# Patient Record
Sex: Female | Born: 1960 | Race: White | Hispanic: No
Health system: Midwestern US, Academic
[De-identification: ages and names within clinical notes are randomized; demographics above are authoritative.]

---

## 2016-06-09 MED ORDER — TROPICAMIDE 1 % OP DROP
1 [drp] | OPHTHALMIC | 0 refills | Status: CN
Start: 2016-06-09 — End: ?

## 2016-06-09 MED ORDER — PHENYLEPHRINE HCL 2.5 % OP DROP
1 [drp] | OPHTHALMIC | 0 refills | Status: CN
Start: 2016-06-09 — End: ?

## 2016-06-09 MED ORDER — PROPARACAINE 0.5 % OP DROP
1 [drp] | OPHTHALMIC | 0 refills | Status: CN
Start: 2016-06-09 — End: ?

## 2016-07-14 ENCOUNTER — Ambulatory Visit: Admit: 2016-07-14 | Discharge: 2016-07-15 | Payer: No Typology Code available for payment source

## 2016-07-14 ENCOUNTER — Encounter: Admit: 2016-07-14 | Discharge: 2016-07-14 | Payer: MEDICARE

## 2016-07-14 DIAGNOSIS — I8393 Asymptomatic varicose veins of bilateral lower extremities: ICD-10-CM

## 2016-07-14 DIAGNOSIS — M1990 Unspecified osteoarthritis, unspecified site: ICD-10-CM

## 2016-07-14 DIAGNOSIS — F329 Major depressive disorder, single episode, unspecified: ICD-10-CM

## 2016-07-14 DIAGNOSIS — H33312 Horseshoe tear of retina without detachment, left eye: Principal | ICD-10-CM

## 2016-07-14 DIAGNOSIS — D649 Anemia, unspecified: ICD-10-CM

## 2016-07-14 DIAGNOSIS — K589 Irritable bowel syndrome without diarrhea: ICD-10-CM

## 2016-07-14 DIAGNOSIS — I208 Other forms of angina pectoris: Principal | ICD-10-CM

## 2016-07-14 DIAGNOSIS — I201 Angina pectoris with documented spasm: ICD-10-CM

## 2016-07-14 DIAGNOSIS — H269 Unspecified cataract: ICD-10-CM

## 2016-07-14 DIAGNOSIS — R51 Headache: ICD-10-CM

## 2016-07-14 NOTE — Progress Notes
Body mass index is 46.81 kg/m.              Assessment and Plan:    Problem   Retinal Tear of Left Eye    Added automatically from request for surgery [phone]         Retinal tear of left eye  One month post laser, doing well no new tear  The patient was educated on the signs/symptoms of retinal detachment, and will call immediately with any changes or concerns  Follow-up is recommended in 3 months, if ok can be released to see her usual eye doctor

## 2016-07-14 NOTE — Assessment & Plan Note
One month post laser, doing well no new tear  The patient was educated on the signs/symptoms of retinal detachment, and will call immediately with any changes or concerns  Follow-up is recommended in 3 months, if ok can be released to see her usual eye doctor

## 2016-11-22 ENCOUNTER — Encounter: Admit: 2016-11-22 | Discharge: 2016-11-22 | Payer: MEDICARE

## 2016-11-22 NOTE — Telephone Encounter
I called pt and she wanted to know what to use for her weepy eye. I called and recommended AT's . Pt is going to try that for a week and see if this helps.

## 2017-07-23 ENCOUNTER — Encounter: Admit: 2017-07-23 | Discharge: 2017-07-23 | Payer: MEDICARE

## 2017-07-31 ENCOUNTER — Ambulatory Visit: Admit: 2017-07-31 | Discharge: 2017-08-01 | Payer: MEDICARE

## 2017-07-31 ENCOUNTER — Encounter: Admit: 2017-07-31 | Discharge: 2017-07-31 | Payer: MEDICARE

## 2017-07-31 DIAGNOSIS — I208 Other forms of angina pectoris: Principal | ICD-10-CM

## 2017-07-31 DIAGNOSIS — I201 Angina pectoris with documented spasm: ICD-10-CM

## 2017-07-31 DIAGNOSIS — F329 Major depressive disorder, single episode, unspecified: ICD-10-CM

## 2017-07-31 DIAGNOSIS — I8393 Asymptomatic varicose veins of bilateral lower extremities: ICD-10-CM

## 2017-07-31 DIAGNOSIS — D649 Anemia, unspecified: ICD-10-CM

## 2017-07-31 DIAGNOSIS — H269 Unspecified cataract: ICD-10-CM

## 2017-07-31 DIAGNOSIS — R51 Headache: ICD-10-CM

## 2017-07-31 DIAGNOSIS — M1990 Unspecified osteoarthritis, unspecified site: ICD-10-CM

## 2017-07-31 DIAGNOSIS — K589 Irritable bowel syndrome without diarrhea: ICD-10-CM

## 2017-08-01 DIAGNOSIS — H33312 Horseshoe tear of retina without detachment, left eye: Secondary | ICD-10-CM

## 2017-08-01 DIAGNOSIS — H4312 Vitreous hemorrhage, left eye: Principal | ICD-10-CM

## 2017-08-30 ENCOUNTER — Encounter: Admit: 2017-08-30 | Discharge: 2017-08-30 | Payer: MEDICARE

## 2017-08-30 ENCOUNTER — Ambulatory Visit: Admit: 2017-08-30 | Discharge: 2017-08-31 | Payer: MEDICARE

## 2017-08-30 DIAGNOSIS — I8393 Asymptomatic varicose veins of bilateral lower extremities: ICD-10-CM

## 2017-08-30 DIAGNOSIS — M1990 Unspecified osteoarthritis, unspecified site: ICD-10-CM

## 2017-08-30 DIAGNOSIS — H33312 Horseshoe tear of retina without detachment, left eye: Secondary | ICD-10-CM

## 2017-08-30 DIAGNOSIS — I201 Angina pectoris with documented spasm: ICD-10-CM

## 2017-08-30 DIAGNOSIS — H269 Unspecified cataract: ICD-10-CM

## 2017-08-30 DIAGNOSIS — F329 Major depressive disorder, single episode, unspecified: ICD-10-CM

## 2017-08-30 DIAGNOSIS — I208 Other forms of angina pectoris: Principal | ICD-10-CM

## 2017-08-30 DIAGNOSIS — D649 Anemia, unspecified: ICD-10-CM

## 2017-08-30 DIAGNOSIS — K589 Irritable bowel syndrome without diarrhea: ICD-10-CM

## 2017-08-30 DIAGNOSIS — R51 Headache: ICD-10-CM

## 2017-08-31 DIAGNOSIS — H43812 Vitreous degeneration, left eye: ICD-10-CM

## 2017-08-31 DIAGNOSIS — H4312 Vitreous hemorrhage, left eye: Principal | ICD-10-CM

## 2017-10-19 ENCOUNTER — Encounter: Admit: 2017-10-19 | Discharge: 2017-10-19 | Payer: MEDICARE

## 2017-10-23 ENCOUNTER — Encounter: Admit: 2017-10-23 | Discharge: 2017-10-23 | Payer: MEDICARE

## 2017-10-23 ENCOUNTER — Ambulatory Visit: Admit: 2017-10-23 | Discharge: 2017-10-24 | Payer: MEDICARE

## 2017-10-23 ENCOUNTER — Emergency Department: Admit: 2017-10-23 | Discharge: 2017-10-24 | Disposition: A | Discharge status: Home / Self Care | Payer: MEDICARE

## 2017-10-23 DIAGNOSIS — H4312 Vitreous hemorrhage, left eye: Principal | ICD-10-CM

## 2017-10-23 DIAGNOSIS — M1990 Unspecified osteoarthritis, unspecified site: ICD-10-CM

## 2017-10-23 DIAGNOSIS — R51 Headache: ICD-10-CM

## 2017-10-23 DIAGNOSIS — H269 Unspecified cataract: ICD-10-CM

## 2017-10-23 DIAGNOSIS — D649 Anemia, unspecified: ICD-10-CM

## 2017-10-23 DIAGNOSIS — I201 Angina pectoris with documented spasm: ICD-10-CM

## 2017-10-23 DIAGNOSIS — F329 Major depressive disorder, single episode, unspecified: ICD-10-CM

## 2017-10-23 DIAGNOSIS — K589 Irritable bowel syndrome without diarrhea: ICD-10-CM

## 2017-10-23 DIAGNOSIS — I208 Other forms of angina pectoris: Principal | ICD-10-CM

## 2017-10-23 DIAGNOSIS — I8393 Asymptomatic varicose veins of bilateral lower extremities: ICD-10-CM

## 2017-10-23 MED ORDER — PROPARACAINE 0.5 % OP DROP
1 [drp] | OPHTHALMIC | 0 refills | Status: CN
Start: 2017-10-23 — End: ?

## 2017-10-23 MED ORDER — CYCLOPENTOLATE 1 % OP DROP
1 [drp] | OPHTHALMIC | 0 refills | Status: CN
Start: 2017-10-23 — End: ?

## 2017-10-23 MED ORDER — PHENYLEPHRINE HCL 2.5 % OP DROP
1 [drp] | OPHTHALMIC | 0 refills | Status: CN
Start: 2017-10-23 — End: ?

## 2017-10-24 ENCOUNTER — Ambulatory Visit: Admit: 2017-10-24 | Discharge: 2017-10-24 | Payer: MEDICARE

## 2017-10-24 ENCOUNTER — Encounter: Admit: 2017-10-24 | Discharge: 2017-10-24 | Payer: MEDICARE

## 2017-10-24 DIAGNOSIS — G4730 Sleep apnea, unspecified: ICD-10-CM

## 2017-10-24 DIAGNOSIS — H4312 Vitreous hemorrhage, left eye: ICD-10-CM

## 2017-10-24 DIAGNOSIS — H269 Unspecified cataract: ICD-10-CM

## 2017-10-24 DIAGNOSIS — H33312 Horseshoe tear of retina without detachment, left eye: Principal | ICD-10-CM

## 2017-10-24 DIAGNOSIS — H5460 Unqualified visual loss, one eye, unspecified: Principal | ICD-10-CM

## 2017-10-24 DIAGNOSIS — M1990 Unspecified osteoarthritis, unspecified site: ICD-10-CM

## 2017-10-24 DIAGNOSIS — K589 Irritable bowel syndrome without diarrhea: ICD-10-CM

## 2017-10-24 DIAGNOSIS — F329 Major depressive disorder, single episode, unspecified: ICD-10-CM

## 2017-10-24 DIAGNOSIS — M797 Fibromyalgia: ICD-10-CM

## 2017-10-24 DIAGNOSIS — R030 Elevated blood-pressure reading, without diagnosis of hypertension: ICD-10-CM

## 2017-10-24 DIAGNOSIS — I8393 Asymptomatic varicose veins of bilateral lower extremities: ICD-10-CM

## 2017-10-24 DIAGNOSIS — I208 Other forms of angina pectoris: Principal | ICD-10-CM

## 2017-10-24 DIAGNOSIS — R51 Headache: ICD-10-CM

## 2017-10-24 DIAGNOSIS — D649 Anemia, unspecified: ICD-10-CM

## 2017-10-24 DIAGNOSIS — I201 Angina pectoris with documented spasm: ICD-10-CM

## 2017-10-24 MED ORDER — PROPARACAINE 0.5 % OP DROP
1 [drp] | OPHTHALMIC | 0 refills | Status: DC
Start: 2017-10-24 — End: 2017-10-24
  Administered 2017-10-24: 15:00:00 1 [drp] via OPHTHALMIC

## 2017-10-24 MED ORDER — CEFAZOLIN INJ 1GM IVP
0 refills | Status: DC
Start: 2017-10-24 — End: 2017-10-24
  Administered 2017-10-24: 16:00:00 50 mg via SUBCONJUNCTIVAL

## 2017-10-24 MED ORDER — DIFLUPREDNATE 0.05 % OP DROP
1 [drp] | Freq: Four times a day (QID) | OPHTHALMIC | 1 refills | 19.00000 days | Status: AC
Start: 2017-10-24 — End: ?

## 2017-10-24 MED ORDER — CYCLOPENTOLATE 1 % OP DROP
1 [drp] | OPHTHALMIC | 0 refills | Status: CN
Start: 2017-10-24 — End: ?

## 2017-10-24 MED ORDER — BUPIVACAINE 0.75%-LIDOCAINE 2%-HYALURONIDASE 150 UNITS SYR
0 refills | Status: DC
Start: 2017-10-24 — End: 2017-10-24
  Administered 2017-10-24 (×3): 7.5 mL via RETROBULBAR

## 2017-10-24 MED ORDER — ERYTHROMYCIN 5 MG/GRAM (0.5 %) OP OINT
0 refills | Status: DC
Start: 2017-10-24 — End: 2017-10-24
  Administered 2017-10-24: 16:00:00 0.25 [in_us] via OPHTHALMIC

## 2017-10-24 MED ORDER — DEXAMETHASONE SODIUM PHOSPHATE 4 MG/ML IJ SOLN
0 refills | Status: DC
Start: 2017-10-24 — End: 2017-10-24
  Administered 2017-10-24: 16:00:00 2 mg via SUBCONJUNCTIVAL

## 2017-10-24 MED ORDER — ATROPINE 1 % OP DROP
0 refills | Status: DC
Start: 2017-10-24 — End: 2017-10-24
  Administered 2017-10-24: 16:00:00 1 [drp] via OPHTHALMIC

## 2017-10-24 MED ORDER — PROPOFOL INJ 10 MG/ML IV VIAL
0 refills | Status: DC
Start: 2017-10-24 — End: 2017-10-24
  Administered 2017-10-24: 15:00:00 30 mg via INTRAVENOUS
  Administered 2017-10-24: 15:00:00 70 mg via INTRAVENOUS

## 2017-10-24 MED ORDER — OFLOXACIN 0.3 % OP DROP
1 [drp] | Freq: Four times a day (QID) | OPHTHALMIC | 0 refills | 32.50000 days | Status: AC
Start: 2017-10-24 — End: 2018-04-12

## 2017-10-24 MED ORDER — PROPARACAINE 0.5 % OP DROP
1 [drp] | OPHTHALMIC | 0 refills | Status: CN
Start: 2017-10-24 — End: ?

## 2017-10-24 MED ORDER — PHENYLEPHRINE HCL 2.5 % OP DROP
1 [drp] | OPHTHALMIC | 0 refills | Status: CN
Start: 2017-10-24 — End: ?

## 2017-10-24 MED ORDER — BALANCED SALT SOLN NO.2 IRRIG. IO SOLN
0 refills | Status: DC
Start: 2017-10-24 — End: 2017-10-24
  Administered 2017-10-24: 16:00:00 15 mL via OPHTHALMIC

## 2017-10-24 MED ORDER — CYCLOPENTOLATE 1 % OP DROP
1 [drp] | OPHTHALMIC | 0 refills | Status: CP
Start: 2017-10-24 — End: ?
  Administered 2017-10-24: 15:00:00 1 [drp] via OPHTHALMIC

## 2017-10-24 MED ORDER — EPINEPHRINE 0.3 MG IN ISOTONIC OPHTH IRR(BSS+)
0 refills | Status: DC
Start: 2017-10-24 — End: 2017-10-24
  Administered 2017-10-24 (×2): 500 mL via INTRAOCULAR

## 2017-10-24 MED ORDER — PHENYLEPHRINE HCL 2.5 % OP DROP
1 [drp] | OPHTHALMIC | 0 refills | Status: CP
Start: 2017-10-24 — End: ?
  Administered 2017-10-24: 15:00:00 1 [drp] via OPHTHALMIC

## 2017-10-24 MED ORDER — MIDAZOLAM 1 MG/ML IJ SOLN
INTRAVENOUS | 0 refills | Status: DC
Start: 2017-10-24 — End: 2017-10-24
  Administered 2017-10-24: 15:00:00 2 mg via INTRAVENOUS

## 2017-10-25 ENCOUNTER — Ambulatory Visit: Admit: 2017-10-25 | Discharge: 2017-10-26 | Payer: MEDICARE

## 2017-10-25 DIAGNOSIS — H4312 Vitreous hemorrhage, left eye: Principal | ICD-10-CM

## 2017-10-26 ENCOUNTER — Encounter: Admit: 2017-10-26 | Discharge: 2017-10-26 | Payer: MEDICARE

## 2017-10-26 DIAGNOSIS — D649 Anemia, unspecified: ICD-10-CM

## 2017-10-26 DIAGNOSIS — H269 Unspecified cataract: ICD-10-CM

## 2017-10-26 DIAGNOSIS — I208 Other forms of angina pectoris: Principal | ICD-10-CM

## 2017-10-26 DIAGNOSIS — G4730 Sleep apnea, unspecified: ICD-10-CM

## 2017-10-26 DIAGNOSIS — I8393 Asymptomatic varicose veins of bilateral lower extremities: ICD-10-CM

## 2017-10-26 DIAGNOSIS — I201 Angina pectoris with documented spasm: ICD-10-CM

## 2017-10-26 DIAGNOSIS — K589 Irritable bowel syndrome without diarrhea: ICD-10-CM

## 2017-10-26 DIAGNOSIS — F329 Major depressive disorder, single episode, unspecified: ICD-10-CM

## 2017-10-26 DIAGNOSIS — M1990 Unspecified osteoarthritis, unspecified site: ICD-10-CM

## 2017-10-26 DIAGNOSIS — R51 Headache: ICD-10-CM

## 2017-11-01 ENCOUNTER — Encounter: Admit: 2017-11-01 | Discharge: 2017-11-01 | Payer: MEDICARE

## 2017-11-02 ENCOUNTER — Ambulatory Visit: Admit: 2017-11-02 | Discharge: 2017-11-03 | Payer: MEDICARE

## 2017-11-02 DIAGNOSIS — H33312 Horseshoe tear of retina without detachment, left eye: Principal | ICD-10-CM

## 2017-11-02 DIAGNOSIS — H4312 Vitreous hemorrhage, left eye: ICD-10-CM

## 2017-12-03 ENCOUNTER — Ambulatory Visit: Admit: 2017-12-03 | Discharge: 2017-12-04 | Payer: MEDICARE

## 2017-12-03 DIAGNOSIS — H43812 Vitreous degeneration, left eye: Principal | ICD-10-CM

## 2017-12-03 DIAGNOSIS — H33312 Horseshoe tear of retina without detachment, left eye: ICD-10-CM

## 2017-12-03 DIAGNOSIS — H2512 Age-related nuclear cataract, left eye: ICD-10-CM

## 2017-12-03 DIAGNOSIS — H4312 Vitreous hemorrhage, left eye: ICD-10-CM

## 2018-02-18 ENCOUNTER — Ambulatory Visit: Admit: 2018-02-18 | Discharge: 2018-02-19 | Payer: MEDICARE

## 2018-02-18 NOTE — Assessment & Plan Note
No new tear or RD  The signs and symptoms of retinal tear and retinal detachment were reviewed.  The patient was instructed to call or return to ED immediately with changes or concerns.  RTC 6 mo

## 2018-02-19 DIAGNOSIS — H33312 Horseshoe tear of retina without detachment, left eye: ICD-10-CM

## 2018-02-19 DIAGNOSIS — H2512 Age-related nuclear cataract, left eye: Principal | ICD-10-CM

## 2018-03-15 ENCOUNTER — Encounter: Admit: 2018-03-15 | Discharge: 2018-03-15 | Payer: MEDICARE

## 2018-03-15 ENCOUNTER — Ambulatory Visit: Admit: 2018-03-15 | Discharge: 2018-03-16 | Payer: MEDICARE

## 2018-03-15 ENCOUNTER — Ambulatory Visit: Admit: 2018-07-11 | Discharge: 2018-07-11

## 2018-03-15 DIAGNOSIS — F329 Major depressive disorder, single episode, unspecified: ICD-10-CM

## 2018-03-15 DIAGNOSIS — H2512 Age-related nuclear cataract, left eye: Principal | ICD-10-CM

## 2018-03-15 DIAGNOSIS — I8393 Asymptomatic varicose veins of bilateral lower extremities: ICD-10-CM

## 2018-03-15 DIAGNOSIS — K589 Irritable bowel syndrome without diarrhea: ICD-10-CM

## 2018-03-15 DIAGNOSIS — R51 Headache: ICD-10-CM

## 2018-03-15 DIAGNOSIS — I201 Angina pectoris with documented spasm: ICD-10-CM

## 2018-03-15 DIAGNOSIS — H269 Unspecified cataract: ICD-10-CM

## 2018-03-15 DIAGNOSIS — G4730 Sleep apnea, unspecified: ICD-10-CM

## 2018-03-15 DIAGNOSIS — M1990 Unspecified osteoarthritis, unspecified site: ICD-10-CM

## 2018-03-15 DIAGNOSIS — I208 Other forms of angina pectoris: Principal | ICD-10-CM

## 2018-03-15 DIAGNOSIS — D649 Anemia, unspecified: ICD-10-CM

## 2018-03-15 MED ORDER — ILEVRO 0.3 % OP DRPS
1 [drp] | Freq: Every day | OPHTHALMIC | 1 refills | Status: AC
Start: 2018-03-15 — End: ?

## 2018-03-15 MED ORDER — DIFLUPREDNATE 0.05 % OP DROP
1 [drp] | Freq: Four times a day (QID) | OPHTHALMIC | 0 refills | 19.00000 days | Status: AC
Start: 2018-03-15 — End: ?

## 2018-03-16 DIAGNOSIS — H2512 Age-related nuclear cataract, left eye: Principal | ICD-10-CM

## 2018-03-20 ENCOUNTER — Encounter: Admit: 2018-03-20 | Discharge: 2018-03-20 | Payer: MEDICARE

## 2018-03-20 NOTE — Telephone Encounter
Received fax from [facility] that they have been unable to reach pt regarding surgery drops. I left a VM on pt's phone to please call the pharmacy at [phone] ext. [phone]

## 2018-04-05 ENCOUNTER — Encounter: Admit: 2018-04-05 | Discharge: 2018-04-05 | Payer: MEDICARE

## 2018-04-08 NOTE — Telephone Encounter
Score: Malnutrition Screening Tool (MST): 0  Additional Nutrition Assessment: Other (Comment)(Intentionally lost 22lbs with change in diet. )     Social & Financial:  Social and Financial Assessment: No needs identified  Tobacco assessment last 30 days: Patient has not used tobacco products within the last 30 days     Spiritual & Emotional:  Spiritual and Emotional Assessment: No needs identified     Physical:  Fall Risk: None identified     Communication:  Communication Barrier: No     Onc Fertility:   Onc Fertility Assessment: Not applicable     Additional Education:  Additional Education Documented: Yes    Patient Education  Education provided to: patient     Are learners ready to learn?: Yes  Are there barriers to learning?: No        Topics Discussed  Topics discussed: [facility] to [facility]     Ph # given to patient for follow up: Yes    Education Details  Educated by: telephone  Ed  time: 5 min  Learner's response: The patient expressed understanding of what was explained to them, participated and agreed with the present plan.;The patient has received contact information and was instructed on how to contact us if questions or concerns arise

## 2018-04-08 NOTE — Progress Notes
.  Pre Clinic Pre Chart:    Problem List:  1. Large rectal TVA  2. Fibromyalgia   3. OSA    03/25/2018 - Colonoscopy:  A digital rectal exam was performed with normal sphincter tone.  No masses palpated, circumferential external skin tags present without thrombosis.  After completion of the digital rectal exam, the colonoscope was introduced and advanced inside the colon without difficulty.  The cecum was identified with photodocumentation of the appendiceal orifice, ileocecal valve.  The bowel prep was 7 out of 9 on a Boston bowel prep scale.  After reaching the cecum, the scope was withdrawn for 13 minutes with circumferential examination of the colonic wall.  2 small hyperplastic appearing polyps measuring 2 x 3 and 3 x 3 mm in the mid sigmoid colon were completely removed using cold forceps polypectomy.  Large 4 x 2 mm pedunculated polyp was found in the rectum approximately 10 cm from the anal verge on the patient's right-hand side.  The polyp was not hemorrhagic or ulcerated, morphological appearance was multiple lobulations present on approximately 2 cm stalk.  The base of the polyp was saline lifted in 3 location successfully.  3 surgical clips placed at the base of the stock left middle and right aspect.  Hot snare was attempted to remove the polyp in 1 piece however initial polypectomy removed approximately two thirds of the polyp.  Repeat snare removed and additional quarter of the polyp.  Multiple cold biopsies were taken of the center of the lesion on the progesterone left just above the surgical clips noted below the surgical clip still abnormal tissue and protruding lesion still present.  The colonoscope was then straightened and any excess air was suctioned.  Patient tolerated the procedure well.  Findings:  Large rectal 4 x 2 cm polyp approximately 10 cm from the anal verge, incomplete removal.  Recommendations follow-up with me in clinic with 1 week for pathology results.  Pathology:

## 2018-04-10 ENCOUNTER — Encounter: Admit: 2018-04-10 | Discharge: 2018-04-10 | Payer: MEDICARE

## 2018-04-10 DIAGNOSIS — I208 Other forms of angina pectoris: Principal | ICD-10-CM

## 2018-04-10 DIAGNOSIS — G4730 Sleep apnea, unspecified: ICD-10-CM

## 2018-04-10 DIAGNOSIS — F329 Major depressive disorder, single episode, unspecified: ICD-10-CM

## 2018-04-10 DIAGNOSIS — K589 Irritable bowel syndrome without diarrhea: ICD-10-CM

## 2018-04-10 DIAGNOSIS — H269 Unspecified cataract: ICD-10-CM

## 2018-04-10 DIAGNOSIS — D649 Anemia, unspecified: ICD-10-CM

## 2018-04-10 DIAGNOSIS — M1990 Unspecified osteoarthritis, unspecified site: ICD-10-CM

## 2018-04-10 DIAGNOSIS — K6289 Other specified diseases of anus and rectum: Principal | ICD-10-CM

## 2018-04-10 DIAGNOSIS — I201 Angina pectoris with documented spasm: ICD-10-CM

## 2018-04-10 DIAGNOSIS — R51 Headache: ICD-10-CM

## 2018-04-10 DIAGNOSIS — D128 Benign neoplasm of rectum: Principal | ICD-10-CM

## 2018-04-10 DIAGNOSIS — I8393 Asymptomatic varicose veins of bilateral lower extremities: ICD-10-CM

## 2018-04-10 MED ORDER — ONDANSETRON 8 MG PO TBDI
8 mg | ORAL_TABLET | ORAL | 0 refills | Status: SS | PRN
Start: 2018-04-10 — End: 2018-07-11

## 2018-04-10 MED ORDER — SODIUM CHLORIDE 0.9 % IV SOLP
250 mL | INTRAVENOUS | 0 refills | Status: CN
Start: 2018-04-10 — End: ?

## 2018-04-10 MED ORDER — CEFOXITIN 2G/100ML NS IVPB (MB+) (OR ONLY)
2 g | Freq: Once | INTRAVENOUS | 0 refills | Status: CN
Start: 2018-04-10 — End: ?

## 2018-04-10 NOTE — Progress Notes
??? Lung disease     small airway lung disease, lung capacity diminished   ??? Sleep apnea     APAP   ??? Variant angina (HCC)     2011   ??? Varicose veins of both lower extremities without ulcer or inflammation      Surgical History:   Procedure Laterality Date   ??? DILATION AND CURETTAGE  1983    after a miscarriage   ??? NOSE SURGERY  1988    septum deviated   ??? GALLBLADDER SURGERY  2009    removal   ??? FOOT SURGERY Bilateral 2011    bilateral fasciotomy   ??? CARPAL TUNNEL RELEASE Right 2011   ??? HEART CATHETERIZATION  2011    no stents, diagnosed with variant angina   ??? KNEE REPLACEMENT Left 2016    partial   ??? HX RETINAL LASER  2018    retinal tear   ??? PARS PLANA MECHANICAL VITRECTOMY WITH FOCAL ENDOLASER PHOTOCOAGULATION Left 10/24/2017    Performed by Blair Hailey, MD at [facility] OR   ??? COLONOSCOPY  03/25/2018   ??? HX ARTHROSCOPIC SURGERY Bilateral 2015, 2016    knees     No family history on file.  Social History     Socioeconomic History   ??? Marital status: Divorced     Spouse name: Not on file   ??? Number of children: Not on file   ??? Years of education: Not on file   ??? Highest education level: Not on file   Occupational History   ??? Not on file   Tobacco Use   ??? Smoking status: Never Smoker   ??? Smokeless tobacco: Never Used   Substance and Sexual Activity   ??? Alcohol use: Yes     Alcohol/week: 0.0 standard drinks     Comment: rarely   ??? Drug use: Never   ??? Sexual activity: Not on file   Other Topics Concern   ??? Not on file   Social History Narrative   ??? Not on file        Review of Systems   Constitutional: Positive for fatigue.   HENT: Negative.    Eyes: Positive for pain and visual disturbance.   Respiratory: Positive for apnea.    Cardiovascular: Negative.    Gastrointestinal: Positive for anal bleeding and diarrhea.   Endocrine: Negative.    Genitourinary: Negative.    Musculoskeletal: Positive for arthralgias, gait problem and myalgias.   Skin: Negative.    Allergic/Immunologic: Negative.    Hematological: Negative. Mental Status: She is alert.   Psychiatric:         Mood and Affect: Mood normal.         Behavior: Behavior normal.         Thought Content: Thought content normal.     Assessment and Plan:  58 year old female with past medical history of fibromyalgia, OSA, pulmonary nodules (stable on serial CT), obesity and HTN now with rectal polyp found on a routine screening colonoscopy which was removed in a piecemeal fashion and biopsy showing tubulovillous adenoma.    -We had a long discussion with Mrs. Shells in regards to her current diagnosis and that it is a precursor to cancer.  With the polyp being removed in a piecemeal fashion, we discussed surgical intervention for a complete resection is removed.  She does wish to have a transanal approach and prefers against a colonic resection.  We did perform a flex sigmoidoscopy today in  clinic which did show the lesion to be on the right side and roughly 6 cm from the anal verge.  There was a clip still in place and no evidence of bleeding.  With the location of this tumor a Transanal Minimally Invasive Surgery (TAMIS) is a good option for her.  Since she does not have a diagnosis of cancer, we will not plan for any staging imaging at this time.  We did discuss the low likely hood of pathology resulting as having cancer within it and need for further workup after the procedure if that were the case.  Risks of the procedure were discussed including risk of intraabdominal perforation if the lesion is proximal enough as well as anterior.  She demonstrated good understanding of these risks and benefits and wishes to proceed with the flex sigmoidoscopy today and subsequent surgical intervention.  All of her questions were answered to her satisfaction.  -Patient seen and discussed with Dr. Daphine Deutscher.    Rowland Lathe, DO  General Surgery PGY-4    ATTESTATION    I personally performed the key portions of the E/M visit, discussed case

## 2018-04-11 ENCOUNTER — Encounter: Admit: 2018-04-11 | Discharge: 2018-04-11 | Payer: MEDICARE

## 2018-04-12 ENCOUNTER — Encounter: Admit: 2018-04-12 | Discharge: 2018-04-12 | Payer: MEDICARE

## 2018-04-12 ENCOUNTER — Ambulatory Visit: Admit: 2018-04-12 | Discharge: 2018-04-13 | Payer: MEDICARE

## 2018-04-12 DIAGNOSIS — G4730 Sleep apnea, unspecified: ICD-10-CM

## 2018-04-12 DIAGNOSIS — R51 Headache: ICD-10-CM

## 2018-04-12 DIAGNOSIS — I208 Other forms of angina pectoris: Principal | ICD-10-CM

## 2018-04-12 DIAGNOSIS — F329 Major depressive disorder, single episode, unspecified: ICD-10-CM

## 2018-04-12 DIAGNOSIS — I201 Angina pectoris with documented spasm: ICD-10-CM

## 2018-04-12 DIAGNOSIS — M1990 Unspecified osteoarthritis, unspecified site: ICD-10-CM

## 2018-04-12 DIAGNOSIS — H269 Unspecified cataract: ICD-10-CM

## 2018-04-12 DIAGNOSIS — J984 Other disorders of lung: ICD-10-CM

## 2018-04-12 DIAGNOSIS — I8393 Asymptomatic varicose veins of bilateral lower extremities: ICD-10-CM

## 2018-04-12 LAB — CBC: Lab: 7.6 10*3/uL (ref 4.5–11.0)

## 2018-04-12 LAB — BASIC METABOLIC PANEL
Lab: 10 pg (ref 3–12)
Lab: 107 MMOL/L (ref 98–110)
Lab: 4.2 MMOL/L (ref 3.5–5.1)

## 2018-04-13 DIAGNOSIS — Z01818 Encounter for other preprocedural examination: Principal | ICD-10-CM

## 2018-04-15 ENCOUNTER — Encounter: Admit: 2018-04-15 | Discharge: 2018-04-15 | Payer: MEDICARE

## 2018-04-15 NOTE — Progress Notes
Patient informed of new surgery start time and informed to arrive by 1200. Patient verbalized understanding.

## 2018-04-16 ENCOUNTER — Encounter: Admit: 2018-04-16 | Discharge: 2018-04-16 | Payer: MEDICARE

## 2018-04-16 ENCOUNTER — Encounter: Admit: 2018-04-12 | Discharge: 2018-04-12 | Payer: MEDICARE

## 2018-04-16 ENCOUNTER — Ambulatory Visit: Admit: 2018-04-16 | Discharge: 2018-04-16 | Payer: MEDICARE

## 2018-04-16 DIAGNOSIS — I201 Angina pectoris with documented spasm: ICD-10-CM

## 2018-04-16 DIAGNOSIS — H269 Unspecified cataract: ICD-10-CM

## 2018-04-16 DIAGNOSIS — D128 Benign neoplasm of rectum: Principal | ICD-10-CM

## 2018-04-16 DIAGNOSIS — F329 Major depressive disorder, single episode, unspecified: ICD-10-CM

## 2018-04-16 DIAGNOSIS — M1990 Unspecified osteoarthritis, unspecified site: ICD-10-CM

## 2018-04-16 DIAGNOSIS — I10 Essential (primary) hypertension: ICD-10-CM

## 2018-04-16 DIAGNOSIS — G4733 Obstructive sleep apnea (adult) (pediatric): ICD-10-CM

## 2018-04-16 DIAGNOSIS — M797 Fibromyalgia: ICD-10-CM

## 2018-04-16 DIAGNOSIS — K6289 Other specified diseases of anus and rectum: ICD-10-CM

## 2018-04-16 DIAGNOSIS — Z6841 Body Mass Index (BMI) 40.0 and over, adult: ICD-10-CM

## 2018-04-16 DIAGNOSIS — I8393 Asymptomatic varicose veins of bilateral lower extremities: ICD-10-CM

## 2018-04-16 DIAGNOSIS — I208 Other forms of angina pectoris: Principal | ICD-10-CM

## 2018-04-16 DIAGNOSIS — G4730 Sleep apnea, unspecified: ICD-10-CM

## 2018-04-16 DIAGNOSIS — E669 Obesity, unspecified: ICD-10-CM

## 2018-04-16 DIAGNOSIS — J984 Other disorders of lung: ICD-10-CM

## 2018-04-16 DIAGNOSIS — R51 Headache: ICD-10-CM

## 2018-04-16 MED ORDER — PROPOFOL INJ 10 MG/ML IV VIAL
0 refills | Status: DC
Start: 2018-04-16 — End: 2018-04-16
  Administered 2018-04-16: 20:00:00 150 mg via INTRAVENOUS

## 2018-04-16 MED ORDER — ROCURONIUM 10 MG/ML IV SOLN
INTRAVENOUS | 0 refills | Status: DC
Start: 2018-04-16 — End: 2018-04-16
  Administered 2018-04-16: 21:00:00 10 mg via INTRAVENOUS
  Administered 2018-04-16: 20:00:00 40 mg via INTRAVENOUS

## 2018-04-16 MED ORDER — LIDOCAINE (PF) 200 MG/10 ML (2 %) IJ SYRG
0 refills | Status: DC
Start: 2018-04-16 — End: 2018-04-16
  Administered 2018-04-16: 20:00:00 100 mg via INTRAVENOUS

## 2018-04-16 MED ORDER — HYDROMORPHONE (PF) 2 MG/ML IJ SYRG
.5 mg | INTRAVENOUS | 0 refills | Status: DC | PRN
Start: 2018-04-16 — End: 2018-04-17

## 2018-04-16 MED ORDER — PHENYLEPHRINE IN 0.9% NACL(PF) 1 MG/10 ML (100 MCG/ML) IV SYRG
INTRAVENOUS | 0 refills | Status: DC
Start: 2018-04-16 — End: 2018-04-16
  Administered 2018-04-16 (×2): 100 ug via INTRAVENOUS

## 2018-04-16 MED ORDER — DEXAMETHASONE SODIUM PHOSPHATE 4 MG/ML IJ SOLN
INTRAVENOUS | 0 refills | Status: DC
Start: 2018-04-16 — End: 2018-04-16
  Administered 2018-04-16: 21:00:00 4 mg via INTRAVENOUS

## 2018-04-16 MED ORDER — ACETAMINOPHEN 500 MG PO TAB
1000 mg | Freq: Once | ORAL | 0 refills | Status: CP
Start: 2018-04-16 — End: ?
  Administered 2018-04-16: 20:00:00 1000 mg via ORAL

## 2018-04-16 MED ORDER — ONDANSETRON HCL (PF) 4 MG/2 ML IJ SOLN
INTRAVENOUS | 0 refills | Status: DC
Start: 2018-04-16 — End: 2018-04-16
  Administered 2018-04-16: 22:00:00 4 mg via INTRAVENOUS

## 2018-04-16 MED ORDER — LIDOCAINE HCL 10 MG/ML (1 %) IJ SOLN
0 refills | Status: DC
Start: 2018-04-16 — End: 2018-04-16
  Administered 2018-04-16: 22:00:00 10 mL via INTRAMUSCULAR

## 2018-04-16 MED ORDER — PATCH DOCUMENTATION - SCOPOLAMINE BASE 1 MG/72HR
1 | Freq: Two times a day (BID) | TRANSDERMAL | 0 refills | Status: DC
Start: 2018-04-16 — End: 2018-04-17

## 2018-04-16 MED ORDER — PROMETHAZINE 25 MG/ML IJ SOLN
6.25 mg | INTRAVENOUS | 0 refills | Status: DC | PRN
Start: 2018-04-16 — End: 2018-04-17

## 2018-04-16 MED ORDER — PROPOFOL 10 MG/ML IV EMUL (INFUSION)(AM)(OR)
0 refills | Status: DC
Start: 2018-04-16 — End: 2018-04-16
  Administered 2018-04-16: 20:00:00 150 ug/kg/min via INTRAVENOUS
  Administered 2018-04-16: 21:00:00 100.000 mL via INTRAVENOUS

## 2018-04-16 MED ORDER — OXYCODONE 5 MG PO TAB
5-10 mg | Freq: Once | ORAL | 0 refills | Status: DC | PRN
Start: 2018-04-16 — End: 2018-04-17

## 2018-04-16 MED ORDER — SCOPOLAMINE BASE 1 MG OVER 3 DAYS TD PT3D
1 | Freq: Once | TRANSDERMAL | 0 refills | Status: DC
Start: 2018-04-16 — End: 2018-04-17
  Administered 2018-04-16: 20:00:00 1 via TRANSDERMAL

## 2018-04-16 MED ORDER — OXYCODONE 5 MG PO TAB
5-10 mg | ORAL_TABLET | ORAL | 0 refills | Status: SS | PRN
Start: 2018-04-16 — End: 2018-07-11

## 2018-04-16 MED ORDER — DEXTRAN 70-HYPROMELLOSE (PF) 0.1-0.3 % OP DPET
0 refills | Status: DC
Start: 2018-04-16 — End: 2018-04-16
  Administered 2018-04-16: 20:00:00 2 [drp] via OPHTHALMIC

## 2018-04-16 MED ORDER — HALOPERIDOL LACTATE 5 MG/ML IJ SOLN
1 mg | Freq: Once | INTRAVENOUS | 0 refills | Status: DC | PRN
Start: 2018-04-16 — End: 2018-04-17

## 2018-04-16 MED ORDER — CEFOXITIN 2G/100ML NS IVPB (MB+) (OR ONLY)
2 g | Freq: Once | INTRAVENOUS | 0 refills | Status: CP
Start: 2018-04-16 — End: ?
  Administered 2018-04-16 (×2): 2 g via INTRAVENOUS

## 2018-04-16 MED ORDER — SODIUM CHLORIDE 0.9 % IV SOLP
250 mL | INTRAVENOUS | 0 refills | Status: DC
Start: 2018-04-16 — End: 2018-04-17
  Administered 2018-04-16: 20:00:00 250 mL via INTRAVENOUS

## 2018-04-16 MED ORDER — FENTANYL CITRATE (PF) 50 MCG/ML IJ SOLN
50 ug | INTRAVENOUS | 0 refills | Status: DC | PRN
Start: 2018-04-16 — End: 2018-04-17

## 2018-04-16 MED ORDER — OXYCODONE 5 MG PO TAB
5-10 mg | ORAL_TABLET | ORAL | 0 refills | 6.00000 days | Status: AC | PRN
Start: 2018-04-16 — End: 2018-04-16
  Filled 2018-04-16 (×2): qty 20, 2d supply, fill #1

## 2018-04-16 MED ORDER — HYDROMORPHONE (PF) 2 MG/ML IJ SYRG
0 refills | Status: DC
Start: 2018-04-16 — End: 2018-04-16
  Administered 2018-04-16: 21:00:00 0.5 mg via INTRAVENOUS
  Administered 2018-04-16: 20:00:00 1 mg via INTRAVENOUS
  Administered 2018-04-16: 22:00:00 0.5 mg via INTRAVENOUS

## 2018-04-16 MED ORDER — LACTATED RINGERS IV SOLP
0 refills | Status: DC
Start: 2018-04-16 — End: 2018-04-16
  Administered 2018-04-16: 20:00:00 via INTRAVENOUS

## 2018-04-16 MED ORDER — SUGAMMADEX 100 MG/ML IV SOLN
INTRAVENOUS | 0 refills | Status: DC
Start: 2018-04-16 — End: 2018-04-16
  Administered 2018-04-16: 22:00:00 239 mg via INTRAVENOUS

## 2018-04-16 MED ORDER — BUPIVACAINE 0.25 % (2.5 MG/ML) IJ SOLN
0 refills | Status: DC
Start: 2018-04-16 — End: 2018-04-16
  Administered 2018-04-16: 22:00:00 10 mL via INTRAMUSCULAR

## 2018-04-16 MED ORDER — SUCCINYLCHOLINE CHLORIDE 20 MG/ML IJ SOLN
INTRAVENOUS | 0 refills | Status: DC
Start: 2018-04-16 — End: 2018-04-16
  Administered 2018-04-16: 20:00:00 200 mg via INTRAVENOUS

## 2018-04-16 MED ORDER — GABAPENTIN 300 MG PO CAP
600 mg | Freq: Once | ORAL | 0 refills | Status: CP
Start: 2018-04-16 — End: ?
  Administered 2018-04-16: 20:00:00 600 mg via ORAL

## 2018-04-16 MED ORDER — MIDAZOLAM 1 MG/ML IJ SOLN
INTRAVENOUS | 0 refills | Status: DC
Start: 2018-04-16 — End: 2018-04-16
  Administered 2018-04-16: 20:00:00 2 mg via INTRAVENOUS

## 2018-04-18 ENCOUNTER — Encounter: Admit: 2018-04-18 | Discharge: 2018-04-18 | Payer: MEDICARE

## 2018-04-18 DIAGNOSIS — M1990 Unspecified osteoarthritis, unspecified site: ICD-10-CM

## 2018-04-18 DIAGNOSIS — H269 Unspecified cataract: ICD-10-CM

## 2018-04-18 DIAGNOSIS — F329 Major depressive disorder, single episode, unspecified: ICD-10-CM

## 2018-04-18 DIAGNOSIS — I201 Angina pectoris with documented spasm: ICD-10-CM

## 2018-04-18 DIAGNOSIS — I208 Other forms of angina pectoris: Principal | ICD-10-CM

## 2018-04-18 DIAGNOSIS — R51 Headache: ICD-10-CM

## 2018-04-18 DIAGNOSIS — G4730 Sleep apnea, unspecified: ICD-10-CM

## 2018-04-18 DIAGNOSIS — J984 Other disorders of lung: ICD-10-CM

## 2018-04-18 DIAGNOSIS — I8393 Asymptomatic varicose veins of bilateral lower extremities: ICD-10-CM

## 2018-04-22 ENCOUNTER — Encounter: Admit: 2018-04-22 | Discharge: 2018-04-22 | Payer: MEDICARE

## 2018-04-22 NOTE — Telephone Encounter
[facility] called over the weekend and reports that her voice is hoarse following surgery wit Dr. Daphine Deutscher last week. It has gotten progressivly worse and she now has a productive cough. Pt asked to be called back.     Pt called back today and LVM. When pt returned call, she reports that she was able to get into her PCP today. They have sent Rx for ABx and determined she has bronchitis. Pt informed to call us should she have any needs from our office. She v/u an appreciated call back.

## 2018-06-18 ENCOUNTER — Encounter: Admit: 2018-06-18 | Discharge: 2018-06-18

## 2018-06-18 NOTE — Telephone Encounter
Call to [facility] left voice message regarding time change on 07/10/2018 from 11:30am to 1:15pm with Dr. Hassell Done and Flex Sig at 1:00pm at [facility] location will mail appointment information.

## 2018-07-03 ENCOUNTER — Encounter: Admit: 2018-07-03 | Discharge: 2018-07-03

## 2018-07-03 DIAGNOSIS — G4730 Sleep apnea, unspecified: Secondary | ICD-10-CM

## 2018-07-03 DIAGNOSIS — I208 Other forms of angina pectoris: Secondary | ICD-10-CM

## 2018-07-03 DIAGNOSIS — I8393 Asymptomatic varicose veins of bilateral lower extremities: Secondary | ICD-10-CM

## 2018-07-03 DIAGNOSIS — R51 Headache: Secondary | ICD-10-CM

## 2018-07-03 DIAGNOSIS — I10 Essential (primary) hypertension: Secondary | ICD-10-CM

## 2018-07-03 DIAGNOSIS — F329 Major depressive disorder, single episode, unspecified: Secondary | ICD-10-CM

## 2018-07-03 DIAGNOSIS — H269 Unspecified cataract: Secondary | ICD-10-CM

## 2018-07-03 DIAGNOSIS — M1990 Unspecified osteoarthritis, unspecified site: Secondary | ICD-10-CM

## 2018-07-03 DIAGNOSIS — J984 Other disorders of lung: Secondary | ICD-10-CM

## 2018-07-03 DIAGNOSIS — I201 Angina pectoris with documented spasm: Secondary | ICD-10-CM

## 2018-07-03 NOTE — Telephone Encounter
I called to speak with pt.  I left a message on her vm stating she does not need to do any gtts prior to having her cataract sx.   The gtts will be for after she has sx done.  Instructed pt to cb if she has any additional questions.  No further action required.

## 2018-07-03 NOTE — Telephone Encounter
Patient returned call and confirmed appointment time change for her procedure with Dr. Hassell Done on 07/10/2018.

## 2018-07-08 NOTE — Progress Notes
.  Pre Clinic Pre Chart:    03/25/2018 - Colonoscopy:  A digital rectal exam was performed with normal sphincter tone.  No masses palpated, circumferential external skin tags present without thrombosis.  After completion of the digital rectal exam, the colonoscope was introduced and advanced inside the colon without difficulty.  The cecum was identified with photodocumentation of the appendiceal orifice, ileocecal valve.  The bowel prep was 7 out of 9 on a Boston bowel prep scale.  After reaching the cecum, the scope was withdrawn for 13 minutes with circumferential examination of the colonic wall.  2 small hyperplastic appearing polyps measuring 2 x 3 and 3 x 3 mm in the mid sigmoid colon were completely removed using cold forceps polypectomy.  Large 4 x 2 mm pedunculated polyp was found in the rectum approximately 10 cm from the anal verge on the patient's right-hand side.  The polyp was not hemorrhagic or ulcerated, morphological appearance was multiple lobulations present on approximately 2 cm stalk.  The base of the polyp was saline lifted in 3 location successfully.  3 surgical clips placed at the base of the stock left middle and right aspect.  Hot snare was attempted to remove the polyp in 1 piece however initial polypectomy removed approximately two thirds of the polyp.  Repeat snare removed and additional quarter of the polyp.  Multiple cold biopsies were taken of the center of the lesion on the progesterone left just above the surgical clips noted below the surgical clip still abnormal tissue and protruding lesion still present.  The colonoscope was then straightened and any excess air was suctioned.  Patient tolerated the procedure well.  Findings:  Large rectal 4 x 2 cm polyp approximately 10 cm from the anal verge, incomplete removal.  Recommendations follow-up with me in clinic with 1 week for pathology results.  Pathology:  A. Mid sigmoid polyp, biopsy:  Hyperplastic polyp B. Rectosigmoid polyp, biopsy:  Tubulovillous adenoma.     04/16/2018 - 1) Flexible sigmoidoscopy 2) Full thickness transanal minimally invasive surgery (TAMIS) resection  Findings:  Polyp in distal rectum at first rectal fold on right aspect of rectum. Closure of wound with good approximation and good hemostasis.  Pathology:  A. Portion of colon, rectal mass, transanal excision:   Tubulovillous adenoma   The resection margins are uninvolved   See comment ???     Comment:   There is no evidence of high-grade dysplasia or invasive carcinoma.

## 2018-07-10 ENCOUNTER — Encounter: Admit: 2018-07-10 | Discharge: 2018-07-10

## 2018-07-10 DIAGNOSIS — R51 Headache: Secondary | ICD-10-CM

## 2018-07-10 DIAGNOSIS — I8393 Asymptomatic varicose veins of bilateral lower extremities: Secondary | ICD-10-CM

## 2018-07-10 DIAGNOSIS — I208 Other forms of angina pectoris: Secondary | ICD-10-CM

## 2018-07-10 DIAGNOSIS — J984 Other disorders of lung: Secondary | ICD-10-CM

## 2018-07-10 DIAGNOSIS — K6289 Other specified diseases of anus and rectum: Secondary | ICD-10-CM

## 2018-07-10 DIAGNOSIS — K621 Rectal polyp: Secondary | ICD-10-CM

## 2018-07-10 DIAGNOSIS — G4730 Sleep apnea, unspecified: Secondary | ICD-10-CM

## 2018-07-10 DIAGNOSIS — I10 Essential (primary) hypertension: Secondary | ICD-10-CM

## 2018-07-10 DIAGNOSIS — H269 Unspecified cataract: Secondary | ICD-10-CM

## 2018-07-10 DIAGNOSIS — F329 Major depressive disorder, single episode, unspecified: Secondary | ICD-10-CM

## 2018-07-10 DIAGNOSIS — I201 Angina pectoris with documented spasm: Secondary | ICD-10-CM

## 2018-07-10 DIAGNOSIS — D128 Benign neoplasm of rectum: Secondary | ICD-10-CM

## 2018-07-10 DIAGNOSIS — M1990 Unspecified osteoarthritis, unspecified site: Secondary | ICD-10-CM

## 2018-07-10 MED ORDER — TETRACAINE HCL (PF) 0.5 % OP DROP
1 [drp] | Freq: Once | OPHTHALMIC | 0 refills | Status: CN
Start: 2018-07-10 — End: ?

## 2018-07-10 MED ORDER — OBRIEN DILATION WITH CIPRO MIXTURE
1 [drp] | OPHTHALMIC | 0 refills | Status: CN
Start: 2018-07-10 — End: ?

## 2018-07-10 NOTE — H&P (View-Only)
Ophthalmology Preoperative History and Physical Exam - Attending Note    CC/Reason for Surgery: Visually significant nuclear sclerotic cataract left eye    HPI:   Pt presents c/o progressive decrease in vision, blurred vision, difficulty reading in dim light, trouble driving at night and significant glare and halos around lights at night.      Past Medical History:  Medical History:   Diagnosis Date   ??? Angina at rest [facility])    ??? Arthritis     Fibromyalgia   ??? Cataract    ??? Depression    ??? Generalized headaches    ??? Hypertension    ??? Lung disease     small airway lung disease, lung capacity diminished   ??? Sleep apnea     APAP   ??? Variant angina (HCC)     2011   ??? Varicose veins of both lower extremities without ulcer or inflammation         Past Surgical History:  Surgical History:   Procedure Laterality Date   ??? DILATION AND CURETTAGE  1983    after a miscarriage   ??? NOSE SURGERY  1988    septum deviated   ??? GALLBLADDER SURGERY  2009    removal   ??? FOOT SURGERY Bilateral 2011    bilateral fasciotomy   ??? CARPAL TUNNEL RELEASE Right 2011   ??? HEART CATHETERIZATION  2011    no stents, diagnosed with variant angina   ??? KNEE REPLACEMENT Left 2016    partial   ??? HX RETINAL LASER  2018    retinal tear   ??? PARS PLANA MECHANICAL VITRECTOMY WITH FOCAL ENDOLASER PHOTOCOAGULATION Left 10/24/2017    Performed by Blair Hailey, MD at [facility] OR   ??? COLONOSCOPY  03/25/2018   ??? TRANSANAL EXCISION RECTAL TUMOR - PARTIAL THICKNESS N/A 04/16/2018    Performed by Benetta Spar, MD at [facility] OR   ??? HX ARTHROSCOPIC SURGERY Bilateral 2015, 2016    knees        Past Ocular History:  pseudophakia OS    Allergies:  Allergies   Allergen Reactions   ??? Cortisone SEE COMMENTS and EDEMA     Cortisone injection intolerance; hand and finger swelled after elbow injection and lower extremity swelled after injection around ankle.         Social History:  Social History     Socioeconomic History   ??? Marital status: Divorced     Spouse name: Not on file

## 2018-07-10 NOTE — Progress Notes
Name: Casey Pruitt [facility]          MRN: [id number]      DOB: September 25, 1960      AGE: 58 y.o.   DATE OF SERVICE: 07/10/2018    Subjective:          Reason for Visit:  Followup - Rectal TVA S/P TAMIS    History of Present Illness   Casey Pruitt is a 58 y.o. female here today for follow up. She has a history of fibromyalgia, OSA, pulmonary nodules (stable on serial CT), obesity and HTN.    She underwent this colonoscopy on 03/25/2018 and she was found to have hyperplastic polyps in the mid sigmoid colon as well as a large pedunculated polyp within the low rectum roughly 10cm from the anal verge.  These polyps were biopsied and the mid sigmoid showed hyperplastic polyps, however the biopsy of the rectal polyp showed a tubulovillous adenoma.  This pedunculated polyp was incompletely removed as well as removed in a piecemeal fashion.    She is now s/p flexible sigmoidoscopy and full thickness transanal minimally invasive resection on 04/16/18.   She presents today for follow up. She is dong well, no complaints. No pain. No blood in stools.     Pathology 04/16/18:   A. Portion of colon, rectal mass, transanal excision:   Tubulovillous adenoma   The resection margins are uninvolved   See comment ???     Comment:   There is no evidence of high-grade dysplasia or invasive carcinoma.          Review of Systems   Constitutional: Negative.    HENT: Negative.    Eyes: Negative.    Respiratory: Negative.    Cardiovascular: Negative.    Gastrointestinal: Negative.    Endocrine: Negative.    Genitourinary: Negative.    Musculoskeletal: Negative.    Skin: Negative.    Allergic/Immunologic: Negative.    Neurological: Negative.    Hematological: Negative.    Psychiatric/Behavioral: Negative.      Objective:        Medical History:   Diagnosis Date   ??? Angina at rest [facility])    ??? Arthritis     Fibromyalgia   ??? Cataract    ??? Depression    ??? Generalized headaches    ??? Hypertension    ??? Lung disease small airway lung disease, lung capacity diminished   ??? Sleep apnea     APAP   ??? Variant angina (HCC)     2011   ??? Varicose veins of both lower extremities without ulcer or inflammation      Surgical History:   Procedure Laterality Date   ??? DILATION AND CURETTAGE  1983    after a miscarriage   ??? NOSE SURGERY  1988    septum deviated   ??? GALLBLADDER SURGERY  2009    removal   ??? FOOT SURGERY Bilateral 2011    bilateral fasciotomy   ??? CARPAL TUNNEL RELEASE Right 2011   ??? HEART CATHETERIZATION  2011    no stents, diagnosed with variant angina   ??? KNEE REPLACEMENT Left 2016    partial   ??? HX RETINAL LASER  2018    retinal tear   ??? PARS PLANA MECHANICAL VITRECTOMY WITH FOCAL ENDOLASER PHOTOCOAGULATION Left 10/24/2017    Performed by Blair Hailey, MD at [facility] OR   ??? COLONOSCOPY  03/25/2018   ??? TRANSANAL EXCISION RECTAL TUMOR - PARTIAL THICKNESS N/A 04/16/2018  Performed by Benetta Spar, MD at [facility] OR   ??? HX ARTHROSCOPIC SURGERY Bilateral 2015, 2016    knees     No family history on file.  Social History     Socioeconomic History   ??? Marital status: Divorced     Spouse name: Not on file   ??? Number of children: Not on file   ??? Years of education: Not on file   ??? Highest education level: Not on file   Occupational History   ??? Not on file   Tobacco Use   ??? Smoking status: Never Smoker   ??? Smokeless tobacco: Never Used   Substance and Sexual Activity   ??? Alcohol use: Yes     Alcohol/week: 0.0 standard drinks     Comment: rarely   ??? Drug use: Never   ??? Sexual activity: Not on file   Other Topics Concern   ??? Not on file   Social History Narrative   ??? Not on file     ??? Cholecalciferol (Vitamin D3) (VITAMIN D-3) 50 mcg (2,000 unit) cap Take  by mouth.   ??? difluprednate (DUREZOL) 0.05 % ophthalmic drop Apply one drop to left eye as directed four times daily. Starting day of surgery.   ??? difluprednate(+) (DUREZOL) 0.05 % ophthalmic emulsion Apply one drop to left eye as directed four times daily. ??? diphenhydrAMINE (BENADRYL) 25 mg capsule Take 25 mg by mouth every 6 hours as needed.   ??? duloxetine DR (CYMBALTA) 60 mg capsule Take 60 mg by mouth at bedtime daily.   ??? ergocalciferol (vitamin D2) (VITAMIN D PO) Take  by mouth.   ??? gabapentin (NEURONTIN) 300 mg capsule Take 600 mg by mouth at bedtime daily.   ??? ILEVRO 0.3 % drps Apply 1 drop to left eye as directed daily. Starting day of surgery.   ??? lisinopril (PRINIVIL; ZESTRIL) 10 mg tablet Take 5-10 mg by mouth at bedtime daily.   ??? meloxicam (MOBIC) 15 mg tablet Take 15 mg by mouth at bedtime daily.   ??? nitroglycerin (NITROSTAT) 0.4 mg tablet Place 0.4 mg under tongue every 5 minutes as needed for Chest Pain. Max of 3 tablets, call 911.   ??? ondansetron (ZOFRAN ODT) 8 mg rapid dissolve tablet Dissolve one tablet by mouth every 8 hours as needed for Nausea or Vomiting. Place on tongue to disolve.   ??? oxyCODONE (ROXICODONE) 5 mg tablet Take one tablet to two tablets by mouth every 4 hours as needed for Pain     Vitals:    07/10/18 1250   BP: 118/76   BP Source: Arm, Left Upper   Patient Position: Sitting   Pulse: 72   Resp: 18   Temp: 36.3 ???C (97.3 ???F)   TempSrc: Temporal   SpO2: 97%   Weight: 118.8 kg (262 lb)   Height: 167.6 cm (66)   PainSc: Three     Body mass index is 42.29 kg/m???.     Pain Score: Three  Pain Loc: Generalized    Fatigue Scale: 1    Pain Addressed:  N/A    Patient Evaluated for a Clinical Trial: No treatment clinical trial available for this patient.     [place] Cooperative Oncology Group performance status is 0, Fully active, able to carry on all pre-disease performance without restriction.Marland Kitchen     Physical Exam  Constitutional:       Appearance: She is well-developed. She is not diaphoretic.   HENT:  Head: Normocephalic and atraumatic.   Eyes:      General: No scleral icterus.     Conjunctiva/sclera: Conjunctivae normal.   Neck:      Musculoskeletal: Normal range of motion.      Vascular: No JVD.      Trachea: No tracheal deviation. Cardiovascular:      Rate and Rhythm: Normal rate.   Pulmonary:      Effort: Pulmonary effort is normal. No respiratory distress.      Breath sounds: No stridor.   Abdominal:      General: There is no distension.      Palpations: Abdomen is soft.      Tenderness: There is no abdominal tenderness.   Genitourinary:     Comments: Flexible Sigmoidoscopy: approximately 8 cm from the anal verge, previous TAMIS site with what appeared to be scar tissue and nearby apparent polyp tissue. Scar and polyp biopsied. Polyp completely removed with cold biopsy forceps.   Musculoskeletal: Normal range of motion.   Skin:     General: Skin is warm and dry.   Neurological:      Mental Status: She is alert and oriented to person, place, and time.   Psychiatric:         Behavior: Behavior normal.     Assessment and Plan:  Jacenta Cogliano is a 58 y.o. female here today for follow up s/p TAMIS 04/16/18 of a rectal tubulovillous adenoma. Scar and nearby polyp boipsied today.     - Return to clinic in 3 months with repeat sigmoidoscopy   - Follow up pathology obtained from today, will call with results     Patient was seen and discussed with Dr. Lynelle Doctor, MD    ATTESTATION    I personally performed the key portions of the E/M visit, discussed case with resident and concur with resident documentation of history, physical exam, assessment, and treatment plan unless otherwise noted.    Staff name:  Benetta Spar, MD Date:  07/18/2018

## 2018-07-11 ENCOUNTER — Ambulatory Visit: Admit: 2018-07-11 | Discharge: 2018-07-11 | Payer: MEDICARE

## 2018-07-11 ENCOUNTER — Encounter: Admit: 2018-07-11 | Discharge: 2018-07-11

## 2018-07-11 ENCOUNTER — Ambulatory Visit: Admit: 2018-07-11 | Discharge: 2018-07-11

## 2018-07-11 DIAGNOSIS — G4730 Sleep apnea, unspecified: ICD-10-CM

## 2018-07-11 DIAGNOSIS — I10 Essential (primary) hypertension: ICD-10-CM

## 2018-07-11 DIAGNOSIS — H269 Unspecified cataract: Secondary | ICD-10-CM

## 2018-07-11 DIAGNOSIS — J984 Other disorders of lung: Secondary | ICD-10-CM

## 2018-07-11 DIAGNOSIS — I8393 Asymptomatic varicose veins of bilateral lower extremities: Secondary | ICD-10-CM

## 2018-07-11 DIAGNOSIS — Z79899 Other long term (current) drug therapy: Secondary | ICD-10-CM

## 2018-07-11 DIAGNOSIS — F329 Major depressive disorder, single episode, unspecified: Secondary | ICD-10-CM

## 2018-07-11 DIAGNOSIS — M797 Fibromyalgia: Secondary | ICD-10-CM

## 2018-07-11 DIAGNOSIS — I201 Angina pectoris with documented spasm: Secondary | ICD-10-CM

## 2018-07-11 DIAGNOSIS — I208 Other forms of angina pectoris: Secondary | ICD-10-CM

## 2018-07-11 DIAGNOSIS — Z791 Long term (current) use of non-steroidal anti-inflammatories (NSAID): ICD-10-CM

## 2018-07-11 DIAGNOSIS — R51 Headache: Secondary | ICD-10-CM

## 2018-07-11 DIAGNOSIS — H2512 Age-related nuclear cataract, left eye: Principal | ICD-10-CM

## 2018-07-11 DIAGNOSIS — M1990 Unspecified osteoarthritis, unspecified site: Secondary | ICD-10-CM

## 2018-07-11 MED ORDER — EPINEPHRINE 0.3 MG IN ISOTONIC OPHTH IRR(BSS)
0 refills | Status: DC
Start: 2018-07-11 — End: 2018-07-11
  Administered 2018-07-11 (×2): 500 mL via INTRAOCULAR

## 2018-07-11 MED ORDER — BALANCED SALT SOLN NO.2 IRRIG. IO SOLN
0 refills | Status: DC
Start: 2018-07-11 — End: 2018-07-11
  Administered 2018-07-11: 14:00:00 15 mL via OPHTHALMIC

## 2018-07-11 MED ORDER — TETRACAINE HCL (PF) 0.5 % OP DROP
0 refills | Status: DC
Start: 2018-07-11 — End: 2018-07-11
  Administered 2018-07-11: 14:00:00 2 [drp] via OPHTHALMIC

## 2018-07-11 MED ORDER — LIDOCAINE (PF) 10 MG/ML (1 %) IJ SOLN
0 refills | Status: DC
Start: 2018-07-11 — End: 2018-07-11
  Administered 2018-07-11: 14:00:00 .4 mL via INTRAMUSCULAR

## 2018-07-11 MED ORDER — MIDAZOLAM 1 MG/ML IJ SOLN
INTRAVENOUS | 0 refills | Status: DC
Start: 2018-07-11 — End: 2018-07-11
  Administered 2018-07-11 (×2): 1 mg via INTRAVENOUS

## 2018-07-11 MED ORDER — OBRIEN DILATION WITH CIPRO MIXTURE
1 [drp] | OPHTHALMIC | 0 refills | Status: CP
Start: 2018-07-11 — End: ?
  Administered 2018-07-11: 14:00:00 1 [drp] via OPHTHALMIC

## 2018-07-11 MED ORDER — TETRACAINE HCL (PF) 0.5 % OP DROP
1 [drp] | Freq: Once | OPHTHALMIC | 0 refills | Status: CP
Start: 2018-07-11 — End: ?
  Administered 2018-07-11: 13:00:00 1 [drp] via OPHTHALMIC

## 2018-07-11 MED ORDER — CHONDROITIN SULF-SOD HYALURON 3 %-4 %(0.5 ML) 1 % (0.55 ML) IO SYRG
0 refills | Status: DC
Start: 2018-07-11 — End: 2018-07-11
  Administered 2018-07-11: 14:00:00 1 via INTRAOCULAR

## 2018-07-11 MED ORDER — MOXIFLOXACIN 0.5 % OP DROP
0 refills | Status: DC
Start: 2018-07-11 — End: 2018-07-11
  Administered 2018-07-11: 15:00:00 0.1 mL via INTRACAMERAL

## 2018-07-11 NOTE — Anesthesia Pre-Procedure Evaluation
Anesthesia Pre-Procedure Evaluation    Name: Casey Pruitt [facility]      MRN: [id number]     DOB: 28-Jun-1960     Age: 58 y.o.     Sex: female   _________________________________________________________________________     Procedure Info:   Procedure Information     Date/Time:  07/11/18 0930    Procedure:  MANUAL/ MECHANICAL EXTRACAPSULAR CATARACT REMOVAL WITH INSERTION INTRAOCULAR LENS PROSTHESIS - 1 STAGE (Left ) - TOPICAL ,   [id number] +17.00    Location:  [facility] ZY60 / [facility] OR    Surgeon:  Gwendolyn Grant, MD          Physical Assessment  Vital Signs (last filed in past 24 hours):  BP: 118/76 (06/24 1250)  Temp: 36.3 ???C (97.3 ???F) (06/24 1250)  Pulse: 72 (06/24 1250)  SpO2: 97 % (06/24 1250)  Height: 167.6 cm (66) (06/24 1250)  Weight: 118.8 kg (262 lb) (06/24 1250)      Patient History   Allergies   Allergen Reactions   ??? Cortisone SEE COMMENTS and EDEMA     Cortisone injection intolerance; hand and finger swelled after elbow injection and lower extremity swelled after injection around ankle.         Current Medications    Medication Directions   Cholecalciferol (Vitamin D3) (VITAMIN D-3) 50 mcg (2,000 unit) cap Take  by mouth.   difluprednate (DUREZOL) 0.05 % ophthalmic drop Apply one drop to left eye as directed four times daily. Starting day of surgery.   difluprednate(+) (DUREZOL) 0.05 % ophthalmic emulsion Apply one drop to left eye as directed four times daily.   diphenhydrAMINE (BENADRYL) 25 mg capsule Take 25 mg by mouth every 6 hours as needed.   duloxetine DR (CYMBALTA) 60 mg capsule Take 60 mg by mouth at bedtime daily.   ergocalciferol (vitamin D2) (VITAMIN D PO) Take  by mouth.   gabapentin (NEURONTIN) 300 mg capsule Take 600 mg by mouth at bedtime daily.   ILEVRO 0.3 % drps Apply 1 drop to left eye as directed daily. Starting day of surgery.   lisinopril (PRINIVIL; ZESTRIL) 10 mg tablet Take 5-10 mg by mouth at bedtime daily.   meloxicam (MOBIC) 15 mg tablet Take 15 mg by mouth at bedtime daily. nitroglycerin (NITROSTAT) 0.4 mg tablet Place 0.4 mg under tongue every 5 minutes as needed for Chest Pain. Max of 3 tablets, call 911.   ondansetron (ZOFRAN ODT) 8 mg rapid dissolve tablet Dissolve one tablet by mouth every 8 hours as needed for Nausea or Vomiting. Place on tongue to disolve.   oxyCODONE (ROXICODONE) 5 mg tablet Take one tablet to two tablets by mouth every 4 hours as needed for Pain     Medical History:   Diagnosis Date   ??? Angina at rest [facility] [place])    ??? Arthritis     Fibromyalgia   ??? Cataract    ??? Depression    ??? Generalized headaches    ??? Hypertension    ??? Lung disease     small airway lung disease, lung capacity diminished   ??? Sleep apnea     APAP   ??? Variant angina (HCC)     2011   ??? Varicose veins of both lower extremities without ulcer or inflammation      Social History     Tobacco Use   ??? Smoking status: Never Smoker   ??? Smokeless tobacco: Never Used   Substance Use Topics   ??? Alcohol use: Yes  Alcohol/week: 0.0 standard drinks     Comment: rarely   ??? Drug use: Never     Surgical History:   Procedure Laterality Date   ??? DILATION AND CURETTAGE  1983    after a miscarriage   ??? NOSE SURGERY  1988    septum deviated   ??? GALLBLADDER SURGERY  2009    removal   ??? FOOT SURGERY Bilateral 2011    bilateral fasciotomy   ??? CARPAL TUNNEL RELEASE Right 2011   ??? HEART CATHETERIZATION  2011    no stents, diagnosed with variant angina   ??? KNEE REPLACEMENT Left 2016    partial   ??? HX RETINAL LASER  2018    retinal tear   ??? PARS PLANA MECHANICAL VITRECTOMY WITH FOCAL ENDOLASER PHOTOCOAGULATION Left 10/24/2017    Performed by Blair Hailey, MD at [facility] OR   ??? COLONOSCOPY  03/25/2018   ??? TRANSANAL EXCISION RECTAL TUMOR - PARTIAL THICKNESS N/A 04/16/2018    Performed by Benetta Spar, MD at [facility] OR   ??? HX ARTHROSCOPIC SURGERY Bilateral 2015, 2016    knees         Review of Systems/Medical History      Patient summary reviewed  Nursing notes reviewed  Pertinent labs reviewed PONV Screening: Female gender and Non-smoker  No history of anesthetic complications  No family history of anesthetic complications        Pulmonary       Not a current smoker        No indications/hx of asthma    no COPD      No pulmonary embolus      No recent URI        Sleep apnea (APAP); compliant      pulmonary nodules (stable on serial CT)- 2017, referred to pulmonary, followed for 2 years  PFTs- small airway disease 2017  Released from pulmonologist 11/2017      Cardiovascular       Recent diagnostic studies:          echocardiogram            Prior to 2011 cath Stress test- diagnosed with variant angina, states this was normal  Cath in 2011 clean    Second Stress ECHO [facility] in [place] 2017 - normal or negative for exercise-induced ischemia       Exercise tolerance: >4 METS (walks in pool at [facility] 5 days per week x 30; denies chest pressure, pain or DOE with this; reports 25 lb weight loss since 12/2017)      Beta Blocker therapy: No      Beta blockers within 24 hours: n/a        Hypertension, well controlled      No valvular problems/murmurs      No past MI,       No hx of coronary artery disease      No PTCA      No palpitations      No dysrhythmias      Angina (variant angina; Ntg Rx, never used a tab due to HA)      No DVT      Hyperlipidemia (mild elevation, no treatment)      No orthopnea      No dyspnea on exertion      No syncope      Variant angina, told to f/u with cardiology PRN- angina spasm-related, no obstructive coronary disease      GI/Hepatic/Renal  No GERD,       No hepatitis      No hx of liver disease     No renal disease      No electrolyte problems      Nausea      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, incomplete removal        Neuro/Psych       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; no recurrence)      Neuromuscular disease (fibromyalgia)      No hx TIA      No CVA Headaches (previous migraines; tension/stress HA now, not debilitating)      No indications/hx of substance abuse      Sensory deficit (L retinal tear; R vitreous detachment; Left cataract)        Psychiatric history          Depression      Musculoskeletal         Arthritis      Endocrine/Other       No diabetes      No hypothyroidism      No hyperthyroidism      No autoimmune disease      No malignancy      Obesity (BMI 43)   Physical Exam    Airway Findings      Mallampati: III      TM distance: >3 FB      Neck ROM: full      Mouth opening: good    Dental Findings: Negative      Cardiovascular Findings:       Rhythm: regular      Rate: normal      No murmur, no carotid bruit, no peripheral edema    Pulmonary Findings:       Breath sounds clear to auscultation.    Abdominal Findings:       Obese      Abdominal exam deferred    Neurological Findings:       Alert and oriented x 3    Constitutional findings:       No acute distress      Well-developed      Well-nourished       Diagnostic Tests  Hematology:   Lab Results   Component Value Date    HGB 12.4 04/12/2018    HCT 37.3 04/12/2018    PLTCT 316 04/12/2018    WBC 7.6 04/12/2018    MCV 83.5 04/12/2018    MCH 27.7 04/12/2018    MCHC 33.2 04/12/2018    MPV 9.5 04/12/2018    RDW 14.9 04/12/2018         General Chemistry:   Lab Results   Component Value Date    NA 140 04/12/2018    K 4.2 04/12/2018    CL 107 04/12/2018    CO2 23 04/12/2018    GAP 10 04/12/2018    BUN 14 04/12/2018    CR 0.77 04/12/2018    GLU 84 04/12/2018    CA 9.3 04/12/2018      Coagulation: No results found for: PT, PTT, INR      Anesthesia Plan    ASA score: 3   Plan: MAC  Induction method: intravenous  NPO status: acceptable      Informed Consent  Anesthetic plan and risks discussed with patient.        Plan discussed with: CRNA.  Comments: (  )  Labs: CBC, BMP  ROI: none  Consults: none

## 2018-07-11 NOTE — Anesthesia Post-Procedure Evaluation
Post-Anesthesia Evaluation    Name: Casey Pruitt [facility]      MRN: [id number]     DOB: 1960-12-01     Age: 58 y.o.     Sex: female   __________________________________________________________________________     Procedure Date: 07/11/2018  Procedure(s) (LRB):  MANUAL/ MECHANICAL EXTRACAPSULAR CATARACT REMOVAL WITH INSERTION INTRAOCULAR LENS PROSTHESIS - 1 STAGE (Left)      Surgeon: Surgeon(s):  Julious Oka, MD    Post-Anesthesia Vitals  BP: 113/61 (06/25 0940)  Pulse: 68 (06/25 0940)  Respirations: 11 PER MINUTE (06/25 0940)  SpO2: 98 % (06/25 0940)   Vitals Value Taken Time   BP 113/61 07/11/2018  9:40 AM   Temp     Pulse 68 07/11/2018  9:40 AM   Respirations 11 PER MINUTE 07/11/2018  9:40 AM   SpO2 98 % 07/11/2018  9:40 AM         Post Anesthesia Evaluation Note    Evaluation location: Pre/Post  Patient participation: recovered; patient participated in evaluation  Level of consciousness: alert  Pain management: adequate    Hydration: normovolemia  Temperature: 36.0C - 38.4C  Airway patency: adequate    Perioperative Events       Post-op nausea and vomiting: no PONV    Postoperative Status  Cardiovascular status: hemodynamically stable  Respiratory status: spontaneous ventilation        Perioperative Events  Perioperative Event: No  Emergency Case Activation: No

## 2018-07-12 ENCOUNTER — Encounter: Admit: 2018-07-12 | Discharge: 2018-07-12

## 2018-07-12 DIAGNOSIS — I201 Angina pectoris with documented spasm: Secondary | ICD-10-CM

## 2018-07-12 DIAGNOSIS — H269 Unspecified cataract: Secondary | ICD-10-CM

## 2018-07-12 DIAGNOSIS — M1990 Unspecified osteoarthritis, unspecified site: Secondary | ICD-10-CM

## 2018-07-12 DIAGNOSIS — G4730 Sleep apnea, unspecified: Secondary | ICD-10-CM

## 2018-07-12 DIAGNOSIS — I8393 Asymptomatic varicose veins of bilateral lower extremities: Secondary | ICD-10-CM

## 2018-07-12 DIAGNOSIS — Z961 Presence of intraocular lens: Principal | ICD-10-CM

## 2018-07-12 DIAGNOSIS — F329 Major depressive disorder, single episode, unspecified: Secondary | ICD-10-CM

## 2018-07-12 DIAGNOSIS — I208 Other forms of angina pectoris: Secondary | ICD-10-CM

## 2018-07-12 DIAGNOSIS — I10 Essential (primary) hypertension: Secondary | ICD-10-CM

## 2018-07-12 DIAGNOSIS — R51 Headache: Secondary | ICD-10-CM

## 2018-07-12 DIAGNOSIS — J984 Other disorders of lung: Secondary | ICD-10-CM

## 2018-07-12 NOTE — Assessment & Plan Note
She feels like cataract is worse in this eye and can tell the difference between both eyes. Would like right one done. We will schedule today.

## 2018-07-12 NOTE — Progress Notes
Ophthalmology Clinic    Encounter Date: 07/12/2018    Subjective:    Casey Pruitt is a 58 y.o. female .  Subjective   Post Operative Visit (1d pov; s/p phaco c IOL OS 07/11/2018.); Vision Change (Pt sts VA OS is great.); Eye Pain (Pt notes minimal irritation.); Spots and/or Floaters (Pt notes a new floater OS, it is white in the center.); and Medications Only (Durezol and Ilevro)      HPI   Patient presents with:  Post Operative Visit: 1d pov; s/p phaco c IOL OS 07/11/2018.  Vision Change: Pt sts VA OS is great.  Eye Pain: Pt notes minimal irritation.  Spots and/or Floaters: Pt notes a new floater OS, it is white in the center.  Medications Only: Durezol and Ilevro         Objective  Base Eye Exam     Visual Acuity (Snellen - Linear)       Right Left    Dist sc  20/40    Dist ph sc  20/25          Tonometry (Tonopen, 11:27 AM)       Right Left    Pressure  14          Neuro/Psych     Oriented x3:  Yes    Mood/Affect:  Normal            Slit Lamp and Fundus Exam     External Exam       Right Left    External Normal Normal          Slit Lamp Exam       Right Left    Lids/Lashes Normal Normal    Conjunctiva/Sclera White and quiet White and quiet    Cornea Clear 1+ temporal edema    Anterior Chamber Deep and quiet 1+ cell    Iris Flat Flat    Lens  1+ NS PCIOL     Vitreous  syneresis clear                     Problem   Pseudophakia of Left Eye    ASC 07/11/2018 Jaleigha Deane     Nuclear Sclerosis of Right Eye       Pseudophakia of left eye  POD1 s/p CE/PCIOL   -Good clinical appearance  -Continue PF QID and Ketorolac QID  -No lifting >10lbs or bending at the waist for 1 week  -Wear shield at night  -Call if worsening vision, redness, or pain  -F/U 1 week    Nuclear sclerosis of right eye  She feels like cataract is worse in this eye and can tell the difference between both eyes. Would like right one done. We will schedule today.        PLEASE CHECK REFRACTION, VA in BOTH eyes, and BAT right eye       Donata Clay, MD Staff Ophthalmologist

## 2018-07-12 NOTE — Assessment & Plan Note
POD1 s/p CE/PCIOL   -Good clinical appearance  -Continue PF QID and Ketorolac QID  -No lifting >10lbs or bending at the waist for 1 week  -Wear shield at night  -Call if worsening vision, redness, or pain  -F/U 1 week

## 2018-07-12 NOTE — Telephone Encounter
LVM for patient regarding pathology from 6/24. Advised to call back with questions.    Fabio Asa. Kae Heller MSN, APRN, AGCNS-BC, AGPCNP-BC  Advanced Practice Provider  Colon and Rectal Surgery  Surgical Oncology  APP for Drs. Montez Hageman, and [employer]  Pager: [phone]

## 2018-07-13 ENCOUNTER — Ambulatory Visit: Admit: 2018-07-12 | Discharge: 2018-07-13

## 2018-07-13 ENCOUNTER — Encounter: Admit: 2018-07-13 | Discharge: 2018-07-13

## 2018-07-13 DIAGNOSIS — H2511 Age-related nuclear cataract, right eye: Secondary | ICD-10-CM

## 2018-07-13 DIAGNOSIS — G4730 Sleep apnea, unspecified: Secondary | ICD-10-CM

## 2018-07-13 DIAGNOSIS — I8393 Asymptomatic varicose veins of bilateral lower extremities: Secondary | ICD-10-CM

## 2018-07-13 DIAGNOSIS — F329 Major depressive disorder, single episode, unspecified: Secondary | ICD-10-CM

## 2018-07-13 DIAGNOSIS — R51 Headache: Secondary | ICD-10-CM

## 2018-07-13 DIAGNOSIS — I10 Essential (primary) hypertension: Secondary | ICD-10-CM

## 2018-07-13 DIAGNOSIS — J984 Other disorders of lung: Secondary | ICD-10-CM

## 2018-07-13 DIAGNOSIS — I201 Angina pectoris with documented spasm: Secondary | ICD-10-CM

## 2018-07-13 DIAGNOSIS — M1990 Unspecified osteoarthritis, unspecified site: Secondary | ICD-10-CM

## 2018-07-13 DIAGNOSIS — H269 Unspecified cataract: Secondary | ICD-10-CM

## 2018-07-13 DIAGNOSIS — I208 Other forms of angina pectoris: Secondary | ICD-10-CM

## 2018-07-22 ENCOUNTER — Encounter: Admit: 2018-07-22 | Discharge: 2018-07-22

## 2018-07-22 ENCOUNTER — Ambulatory Visit: Admit: 2018-07-22 | Discharge: 2018-07-23

## 2018-07-22 DIAGNOSIS — I208 Other forms of angina pectoris: Secondary | ICD-10-CM

## 2018-07-22 DIAGNOSIS — H269 Unspecified cataract: Secondary | ICD-10-CM

## 2018-07-22 DIAGNOSIS — R51 Headache: Secondary | ICD-10-CM

## 2018-07-22 DIAGNOSIS — I10 Essential (primary) hypertension: Secondary | ICD-10-CM

## 2018-07-22 DIAGNOSIS — M1990 Unspecified osteoarthritis, unspecified site: Secondary | ICD-10-CM

## 2018-07-22 DIAGNOSIS — G4730 Sleep apnea, unspecified: Secondary | ICD-10-CM

## 2018-07-22 DIAGNOSIS — H35372 Puckering of macula, left eye: Secondary | ICD-10-CM

## 2018-07-22 DIAGNOSIS — I8393 Asymptomatic varicose veins of bilateral lower extremities: Secondary | ICD-10-CM

## 2018-07-22 DIAGNOSIS — I201 Angina pectoris with documented spasm: Secondary | ICD-10-CM

## 2018-07-22 DIAGNOSIS — F329 Major depressive disorder, single episode, unspecified: Secondary | ICD-10-CM

## 2018-07-22 DIAGNOSIS — J984 Other disorders of lung: Secondary | ICD-10-CM

## 2018-07-22 NOTE — Progress Notes
Ophthalmology Clinic    Encounter Date: 07/22/2018    Subjective:    Casey Pruitt is a 58 y.o. female .  Subjective   Post Operative Visit (1 wk POV s/p PCIOL, left eye. Has noticed improvement in vision. No pain or discomfort. )      HPI   Patient is doing well. VA is improved. Does notice a little distortion in the left eye. Didn't know she was supposed to use the drops QID.        Objective  Base Eye Exam     Visual Acuity (Snellen - Linear)       Right Left    Dist sc  20/30 -2    Dist cc 20/20 -1     Correction:  Glasses          Tonometry (Tonopen, 10:46 AM)       Right Left    Pressure 14 12          Neuro/Psych     Oriented x3:  Yes    Mood/Affect:  Normal            Additional Tests     Glare Testing       Medium    Right 20/25    Left             Slit Lamp and Fundus Exam     External Exam       Right Left    External Normal Normal          Slit Lamp Exam       Right Left    Lids/Lashes Normal Normal    Conjunctiva/Sclera White and quiet White and quiet    Cornea Clear Clear    Anterior Chamber Deep and quiet trc cell    Iris Flat coloboma inferiorly    Lens  1+ NS PCIOL     Vitreous  syneresis clear            Refraction     Wearing Rx       Sphere Cylinder Axis    Right -1.50 +0.50 100    Left -1.00 +1.00 064    Type:  SVL          Manifest Refraction       Sphere Cylinder Axis Dist VA    Right -1.75 +1.00 110 20/20-    Left [place] Sphere  20/30                     Problem   Epiretinal Membrane (Erm) of Left Eye   Pseudophakia of Left Eye    ASC 07/11/2018 Doak Mah         Pseudophakia of left eye  POD7 s/p CE/PCIOL  -Good clinical appearance, a little bit of inflammation but not using PF QID  -Continue PF TID x 1 week, then BID x 1 week, then 1x/day x 1 week, then stop  -Continue Ketorolac TID until bottle runs out  -D/C Restrictions and shield  -F/U 3 weeks for MRx, DFE    Epiretinal membrane (ERM) of left eye  ERM has developed in the left eye Likely cause for distortion and inability to get to 20/20  Recommend follow up with retina   Patient states she is going to wait a little bit before seeing Dr. Alla Feeling         Follow-Up: Return in about 3 weeks (around 08/12/2018) for post op appt.   Test  Comments  MRx  [x]  Auto-Rx   Atlas  []     OCT  [x]  M  []  N    LG  []     Schirmer  []  1 Drop of Alticaine   []  No numbing drops    IOP  [x]  Tono   []  iCare   []  Applanate    Pach  []     BAM  []     IOL master  []     HVF  []  Sita Fast  []  24-2       []  Standard  []  10-2    TOSM  []     Optos  []         Dilate  [x] 1 drop Tropicamide 1% & 1 drop Phenylephrine 2.5%   []  Other (see comments)            Donata Clay, MD  Staff Ophthalmologist

## 2018-07-22 NOTE — Assessment & Plan Note
ERM has developed in the left eye   Likely cause for distortion and inability to get to 20/20  Recommend follow up with retina   Patient states she is going to wait a little bit before seeing Dr. Davonna Belling

## 2018-07-22 NOTE — Assessment & Plan Note
POD7 s/p CE/PCIOL  -Good clinical appearance, a little bit of inflammation but not using PF QID  -Continue PF TID x 1 week, then BID x 1 week, then 1x/day x 1 week, then stop  -Continue Ketorolac TID until bottle runs out  -D/C Restrictions and shield  -F/U 3 weeks for MRx, DFE

## 2018-07-23 DIAGNOSIS — Z961 Presence of intraocular lens: Principal | ICD-10-CM

## 2018-08-13 ENCOUNTER — Encounter: Admit: 2018-08-13 | Discharge: 2018-08-13

## 2018-08-13 ENCOUNTER — Ambulatory Visit: Admit: 2018-08-13 | Discharge: 2018-08-14

## 2018-08-13 DIAGNOSIS — J984 Other disorders of lung: Secondary | ICD-10-CM

## 2018-08-13 DIAGNOSIS — R51 Headache: Secondary | ICD-10-CM

## 2018-08-13 DIAGNOSIS — H269 Unspecified cataract: Secondary | ICD-10-CM

## 2018-08-13 DIAGNOSIS — H35372 Puckering of macula, left eye: Secondary | ICD-10-CM

## 2018-08-13 DIAGNOSIS — M1990 Unspecified osteoarthritis, unspecified site: Secondary | ICD-10-CM

## 2018-08-13 DIAGNOSIS — F329 Major depressive disorder, single episode, unspecified: Secondary | ICD-10-CM

## 2018-08-13 DIAGNOSIS — I10 Essential (primary) hypertension: Secondary | ICD-10-CM

## 2018-08-13 DIAGNOSIS — I201 Angina pectoris with documented spasm: Secondary | ICD-10-CM

## 2018-08-13 DIAGNOSIS — I208 Other forms of angina pectoris: Secondary | ICD-10-CM

## 2018-08-13 DIAGNOSIS — I8393 Asymptomatic varicose veins of bilateral lower extremities: Secondary | ICD-10-CM

## 2018-08-13 DIAGNOSIS — G4730 Sleep apnea, unspecified: Secondary | ICD-10-CM

## 2018-08-13 DIAGNOSIS — Z961 Presence of intraocular lens: Secondary | ICD-10-CM

## 2018-08-13 NOTE — Progress Notes
Ophthalmology Clinic    Encounter Date: 08/13/2018    Subjective:    Kaleiya Nachbar is a 58 y.o. female .  Subjective   Post Operative Visit (S/p PCIOL OS 07/11/2018); Vision Change (Pt sts VA OS is stable to last visit. Pt sts she is not wanting to have any other sx.); Eye Pain (Pt denies pain or irritation.); and Spots and/or Floaters (Pt denies floaters or flashes.)      HPI   Patient presents with:  Post Operative Visit: S/p PCIOL OS 07/11/2018  Vision Change: Pt sts VA OS is stable to last visit. Pt sts she is not wanting to have any other sx.  Eye Pain: Pt denies pain or irritation.  Spots and/or Floaters: Pt denies floaters or flashes.         Objective  Base Eye Exam     Visual Acuity (Snellen - Linear)       Right Left    Dist cc 20/20 20/40    Dist ph cc  20/20 -1          Tonometry (iCare Tonometer, 10:43 AM)       Right Left    Pressure 17 17          Pupils       Dark Shape React APD    Right 5 Round Brisk None    Left 5 Irregular Brisk None          Visual Fields (Counting fingers)       Left Right     Full Full          Extraocular Movement       Right Left     Full Full          Neuro/Psych     Oriented x3:  Yes    Mood/Affect:  Normal          Dilation     Both eyes:  1.0% Tropicamide, 2.5% Phenylephrine @ 10:43 AM            Slit Lamp and Fundus Exam     External Exam       Right Left    External Normal Normal          Slit Lamp Exam       Right Left    Lids/Lashes Normal Normal    Conjunctiva/Sclera White and quiet White and quiet    Cornea Clear Clear    Anterior Chamber Deep and quiet trc cell    Iris Flat coloboma inferiorly    Lens  1+ NS PCIOL     Vitreous  syneresis clear          Fundus Exam       Right Left    Disc Sharp, healthy rim Sharp, healthy rim    C/D Ratio 0.3 0.3    Macula Flat ERM    Vessels Normal caliber and number Normal caliber and number    Periphery Attached, no breaks or tears Attached, no breaks or tears            Refraction     Wearing Rx Sphere Cylinder Axis Add    Right -1.50 +0.50 086 +2.00    Left -0.50 +1.00 075 +2.00    Type:  PAL          Manifest Refraction (Auto)       Sphere Cylinder Axis Dist VA Add [place]    Right -1.50 +1.00 094  Left -0.25 +1.50 089             Manifest Refraction #2       Sphere Cylinder Axis Dist VA Add [place]    Right -1.50 +1.25 090 20/20 +2.25 20/20    Left -0.25 +1.50 084 20/25+2 +2.25 20/20          Final Rx       Sphere Cylinder Axis Dist VA Add [place]    Right -1.50 +1.25 090 20/20 +2.25 20/20    Left -0.25 +1.50 084 20/25+2 +2.25 20/20    Type:  Bifocal - Progressive    Expiration Date:  08/14/2019    Comments:  post cataract surgery                     Problem   Epiretinal Membrane (Erm) of Left Eye   Pseudophakia of Left Eye    ASC 07/11/2018 Abe Schools         Epiretinal membrane (ERM) of left eye  Unable to get to 20/20 but vision improved per patient  She would prefer to wait   F/U 1 year or sooner if notices vision changes    Pseudophakia of left eye  Doing well   New Mrx today           Follow-Up: Return in about 1 year (around 08/13/2019) for annual exam.   Test  Comments   MRx  [x]     Atlas  []     OCT  [x]  M  []  N    LG  []     Schirmer  []  1 Drop of Alticaine   []  No numbing drops    IOP  [x]  Tono   []  iCare   []  Applanate    Pach  []     BAM  [x]     IOL master  []     HVF  []  Sita Fast  []  24-2       []  Standard  []  10-2    TOSM  []     Optos  []         Dilate  [x] 1 drop Tropicamide 1% & 1 drop Phenylephrine 2.5%   []  Other (see comments)            Donata Clay, MD  Staff Ophthalmologist

## 2018-08-13 NOTE — Assessment & Plan Note
Unable to get to 20/20 but vision improved per patient  She would prefer to wait   F/U 1 year or sooner if notices vision changes

## 2018-08-13 NOTE — Assessment & Plan Note
Doing well   New Mrx today

## 2018-10-16 ENCOUNTER — Encounter: Admit: 2018-10-16 | Discharge: 2018-10-16 | Payer: MEDICARE

## 2018-10-16 DIAGNOSIS — I10 Essential (primary) hypertension: Secondary | ICD-10-CM

## 2018-10-16 DIAGNOSIS — R519 Generalized headaches: Secondary | ICD-10-CM

## 2018-10-16 DIAGNOSIS — F329 Major depressive disorder, single episode, unspecified: Secondary | ICD-10-CM

## 2018-10-16 DIAGNOSIS — G4730 Sleep apnea, unspecified: Secondary | ICD-10-CM

## 2018-10-16 DIAGNOSIS — I8393 Asymptomatic varicose veins of bilateral lower extremities: Secondary | ICD-10-CM

## 2018-10-16 DIAGNOSIS — I201 Angina pectoris with documented spasm: Secondary | ICD-10-CM

## 2018-10-16 DIAGNOSIS — J984 Other disorders of lung: Secondary | ICD-10-CM

## 2018-10-16 DIAGNOSIS — I208 Other forms of angina pectoris: Secondary | ICD-10-CM

## 2018-10-16 DIAGNOSIS — H269 Unspecified cataract: Secondary | ICD-10-CM

## 2018-10-16 DIAGNOSIS — M1990 Unspecified osteoarthritis, unspecified site: Secondary | ICD-10-CM

## 2019-02-28 ENCOUNTER — Encounter: Admit: 2019-02-28 | Discharge: 2019-02-28 | Payer: MEDICARE

## 2019-02-28 DIAGNOSIS — Z20822 Encounter for screening laboratory testing for COVID-19 virus in asymptomatic patient: Secondary | ICD-10-CM

## 2019-02-28 DIAGNOSIS — D128 Benign neoplasm of rectum: Secondary | ICD-10-CM

## 2019-02-28 MED ORDER — SODIUM CHLORIDE 0.9 % IV SOLP
250 mL | INTRAVENOUS | 0 refills | Status: CN
Start: 2019-02-28 — End: ?

## 2019-02-28 NOTE — Telephone Encounter
This RN called [facility] as there was a note in her chart regarding 3/10 appointment was cancelled. Upon speaking with pt, she decided to perform her colonoscopy with DR. TAckett closer to home. She has no f/u at this time, but feels she will f/u with Dr. Landry Mellow and if needed, be referred back to [facility] if something else should be found.     This RN will cancel 2/24 colonoscopy. Pt reports her scope is setup for 3/24 with Dr. Shon Hough. n

## 2019-03-12 ENCOUNTER — Encounter: Admit: 2019-03-12 | Discharge: 2019-03-12 | Payer: MEDICARE

## 2019-06-04 IMAGING — CR CHEST
2 series · 2 of 2 positions shown · non-contrast
Comparison: none

[chest pa x-wise]
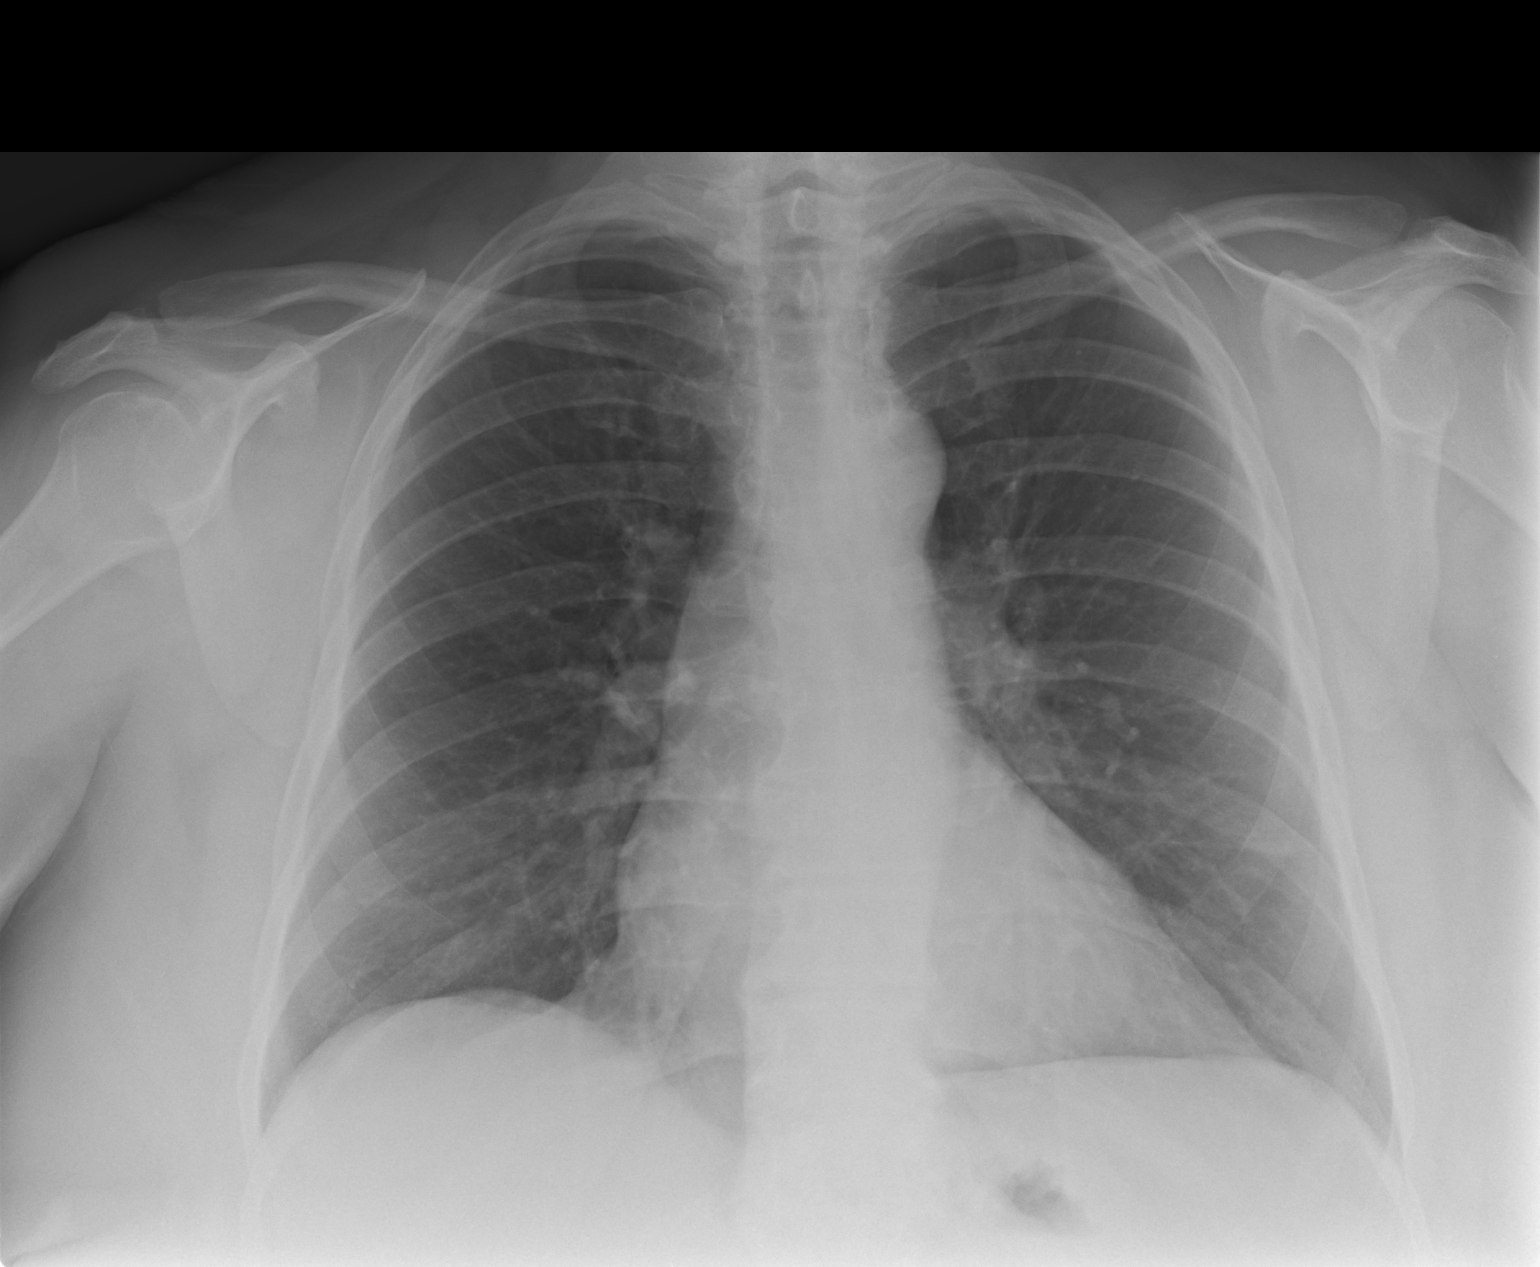

[chest lat]
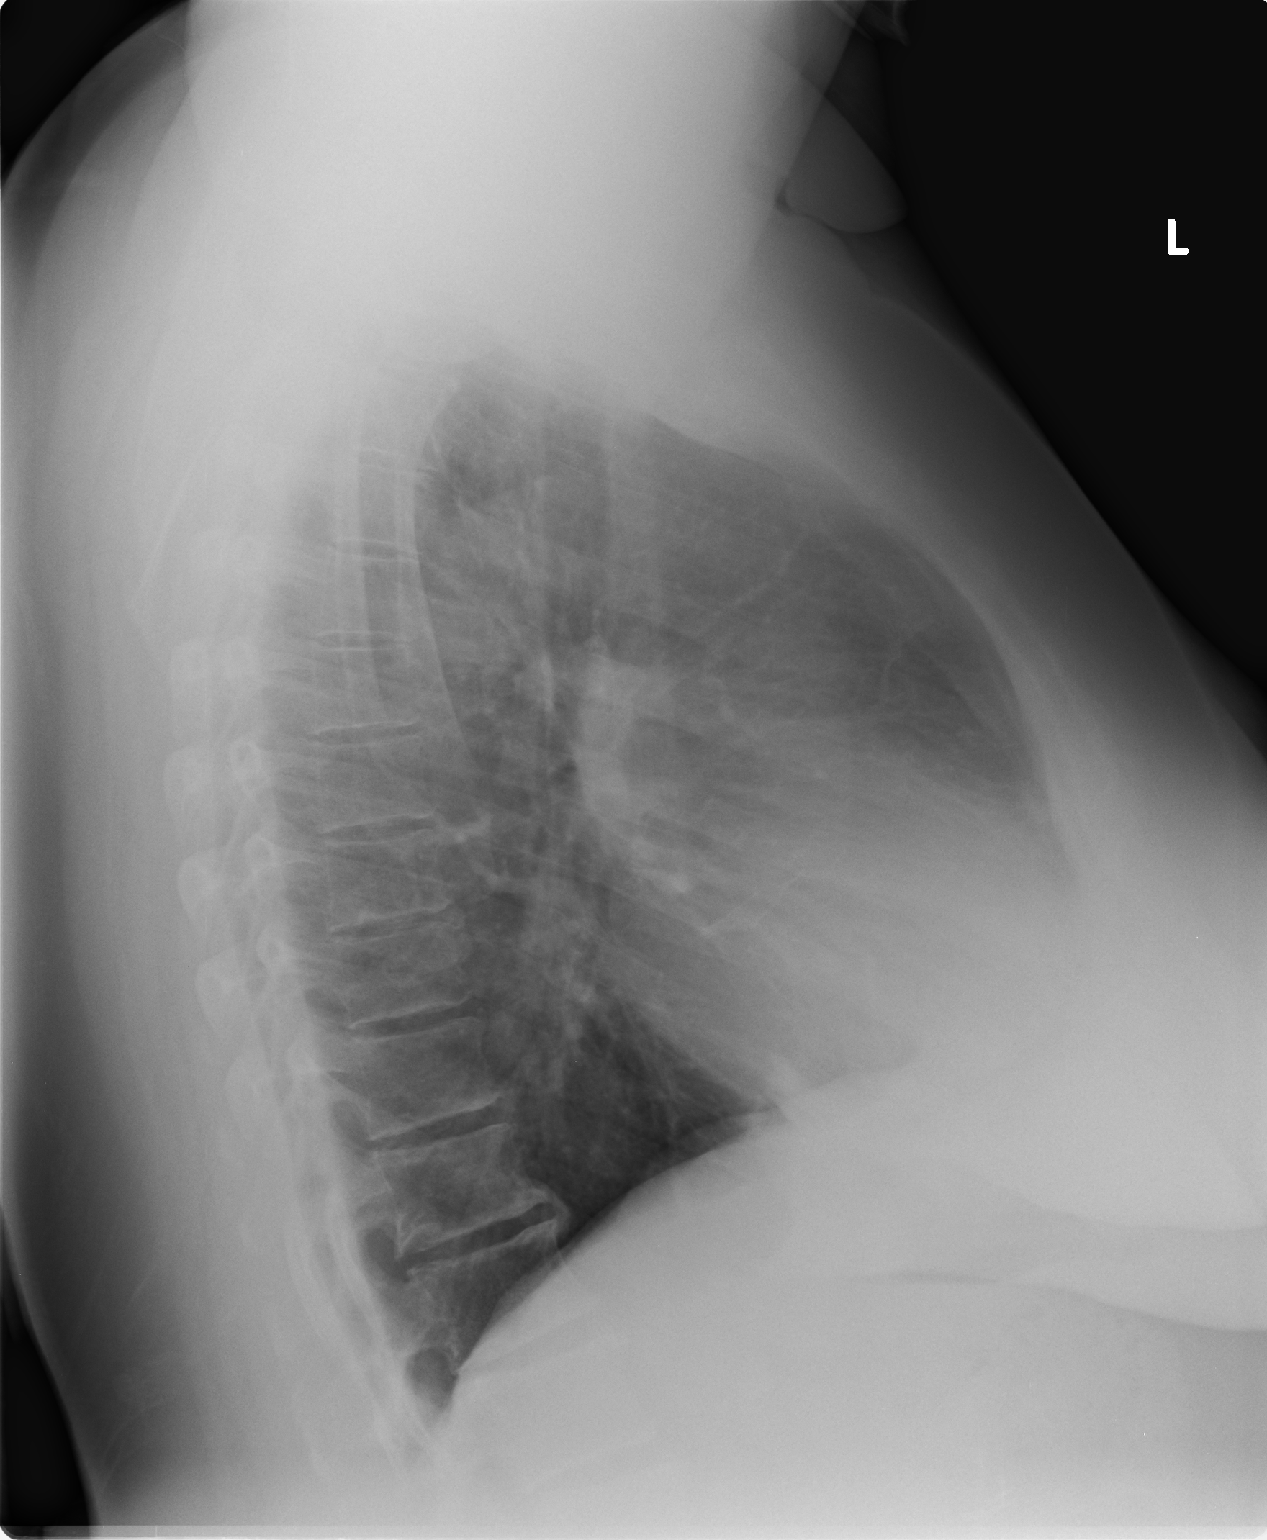

[2 of 2 positions shown; findings below may reference images not displayed]

DIAGNOSTIC STUDIES

EXAM

RADIOLOGICAL EXAMINATION, CHEST; 2 VIEWS FRONTAL AND LATERAL CPT 48292

INDICATION

cough, surgery 6 days ago, phlegm Serge
FEVER, PERSISTEN COUGH

TECHNIQUE

PA and lateral view chest

COMPARISONS

No prior studies are available for comparison.

FINDINGS

There is hyperinflation and chronic interstitial changes. there is no focal consolidation,
effusion, or pneumothorax. Cardiac silhouette is within normal limits. The bony thorax is intact.

IMPRESSION

Hyperinflation and chronic interstitial changes without dense consolidation.

Tech Notes:

FEVER, PERSISTEN COUGH

## 2021-10-30 ENCOUNTER — Encounter: Admit: 2021-10-30 | Discharge: 2021-10-30 | Payer: MEDICARE

## 2022-02-03 IMAGING — CR KNEELMLT
2 series · 2 of 2 positions shown · non-contrast
Comparison: none

[knee ap]
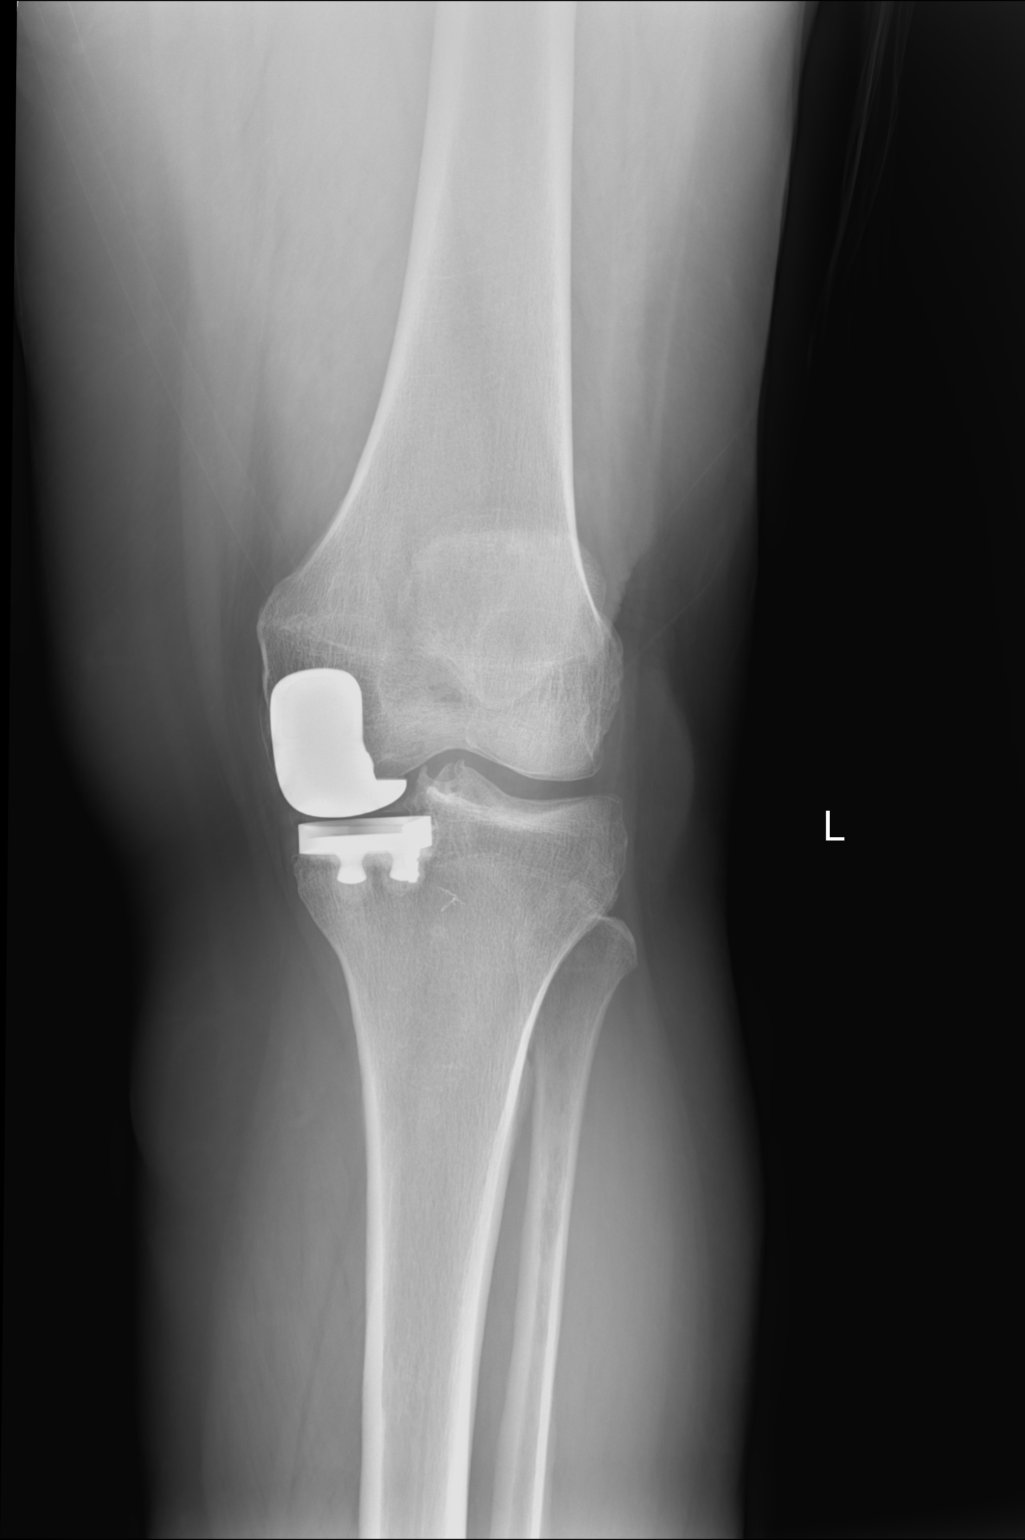

[knee lat]
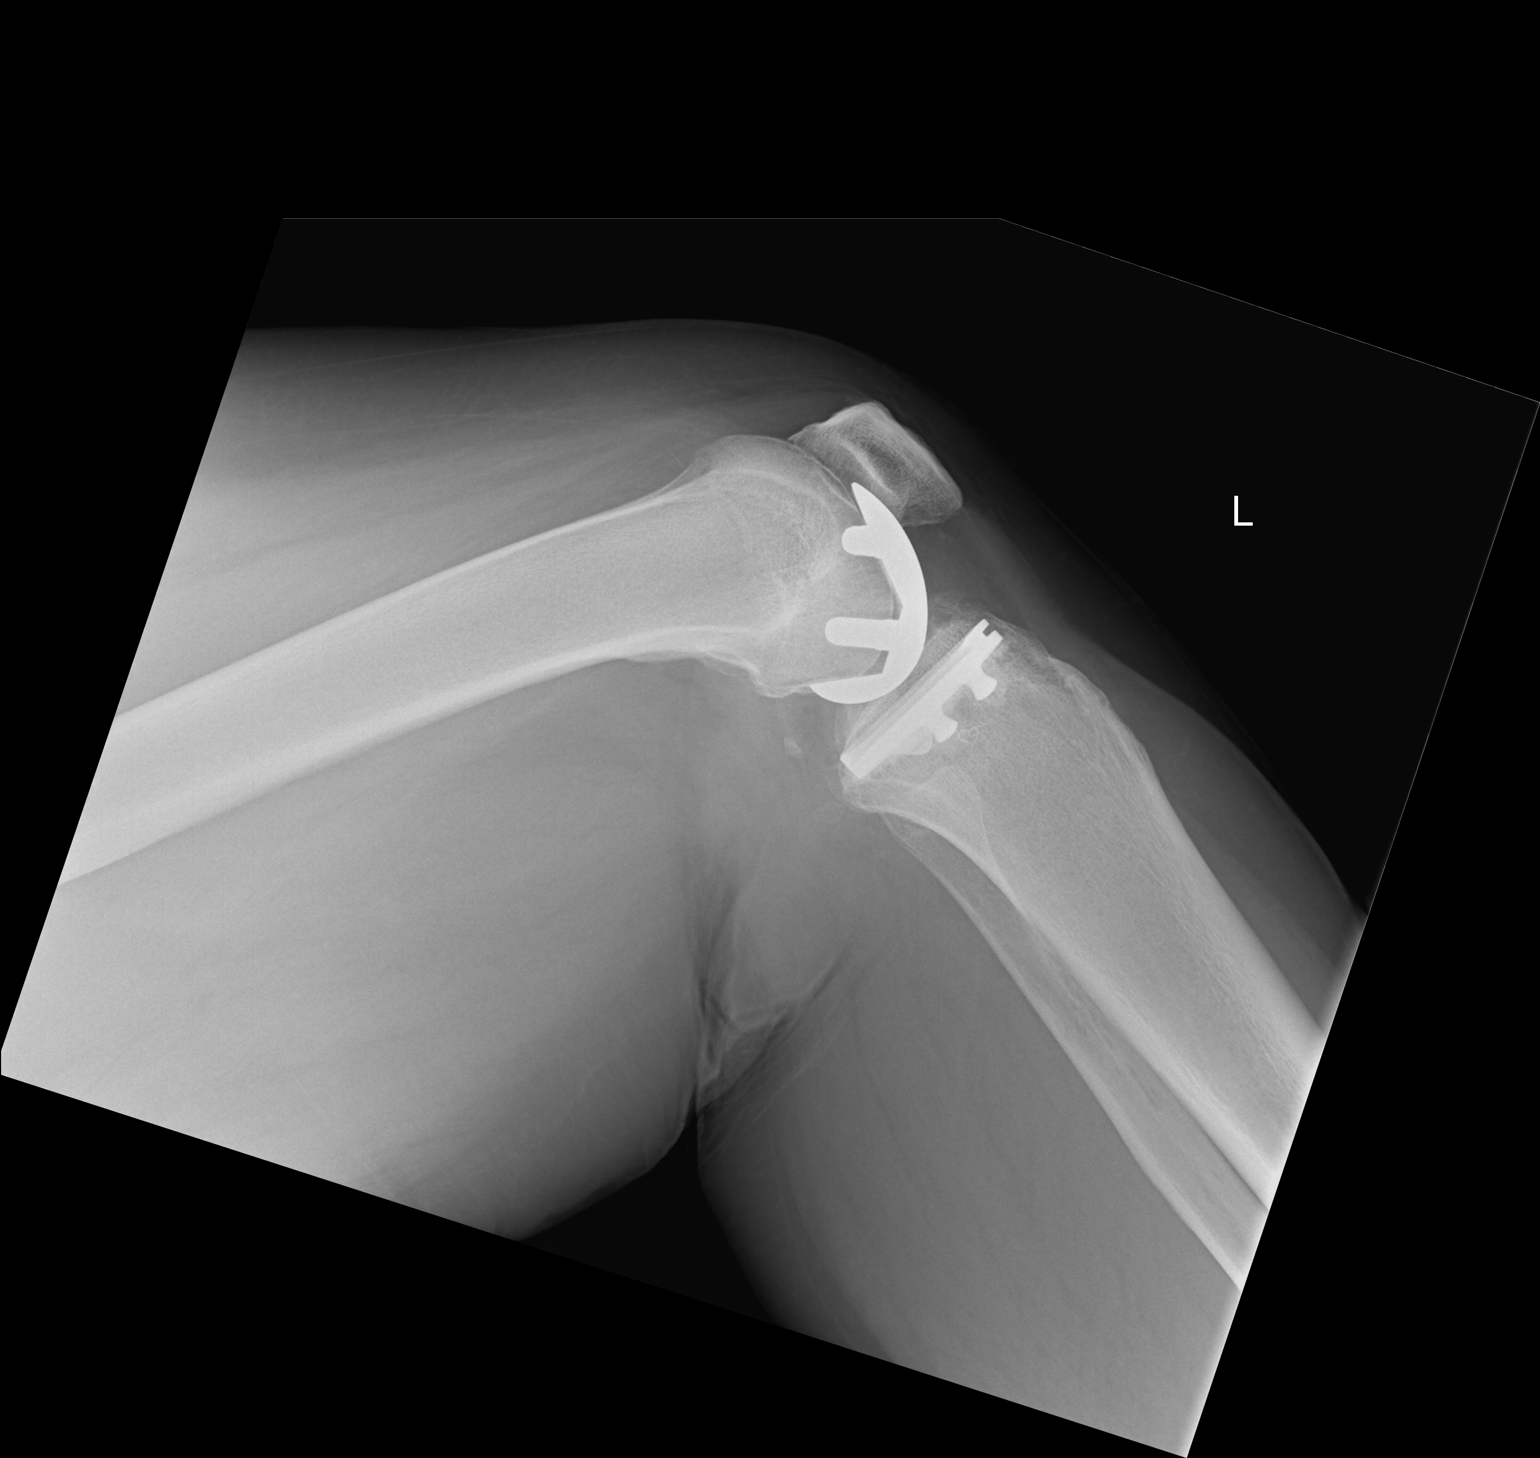

[2 of 2 positions shown; findings below may reference images not displayed]

EXAM

XR knee LT 2V

INDICATION

Chronic knee pain

TECHNIQUE

Two views of the left knee

COMPARISONS

None available at the time of dictation.

FINDINGS

Status post medial unicompartmental arthroplasty. No osseous lucency along the hardware bone
interfaces. No perihardware fracture or dislocation. No knee joint effusion. No significant lateral
weight-bearing compartment joint space loss.

IMPRESSION
1. Status post medial unicompartmental arthroplasty without radiographic evidence of hardware
complication.

Tech Notes:

CHRONIC KNEE PAIN ALL OVER KNEE- HX OF REPLACEMENT 6 YEARS AGO
